# Patient Record
Sex: Male | Born: 1980
Health system: Southern US, Community
[De-identification: ages and names within clinical notes are randomized; demographics above are authoritative.]

## PROBLEM LIST (undated history)

## (undated) HISTORY — PX: FRACTURE SURGERY: SHX138

## (undated) HISTORY — PX: KNEE ARTHROSCOPY: SUR90

---

## 2017-09-21 ENCOUNTER — Encounter (HOSPITAL_COMMUNITY): Admission: EM | Disposition: A | Discharge status: Home / Self Care | Payer: Self-pay | Source: Home / Self Care

## 2017-09-21 ENCOUNTER — Inpatient Hospital Stay (HOSPITAL_COMMUNITY): Payer: Self-pay | Admitting: Anesthesiology

## 2017-09-21 ENCOUNTER — Inpatient Hospital Stay (HOSPITAL_COMMUNITY)
Admission: EM | Admit: 2017-09-21 | Discharge: 2017-09-22 | DRG: 510 | Disposition: A | Discharge status: Home / Self Care | Payer: Self-pay | Attending: General Surgery | Admitting: General Surgery

## 2017-09-21 ENCOUNTER — Emergency Department (HOSPITAL_COMMUNITY): Payer: Self-pay

## 2017-09-21 ENCOUNTER — Encounter (HOSPITAL_COMMUNITY): Payer: Self-pay

## 2017-09-21 DIAGNOSIS — S2232XA Fracture of one rib, left side, initial encounter for closed fracture: Secondary | ICD-10-CM | POA: Diagnosis present

## 2017-09-21 DIAGNOSIS — W19XXXA Unspecified fall, initial encounter: Secondary | ICD-10-CM

## 2017-09-21 DIAGNOSIS — S2239XA Fracture of one rib, unspecified side, initial encounter for closed fracture: Secondary | ICD-10-CM

## 2017-09-21 DIAGNOSIS — T1490XA Injury, unspecified, initial encounter: Secondary | ICD-10-CM | POA: Diagnosis present

## 2017-09-21 DIAGNOSIS — F17210 Nicotine dependence, cigarettes, uncomplicated: Secondary | ICD-10-CM | POA: Diagnosis present

## 2017-09-21 DIAGNOSIS — Y990 Civilian activity done for income or pay: Secondary | ICD-10-CM

## 2017-09-21 DIAGNOSIS — S066X9A Traumatic subarachnoid hemorrhage with loss of consciousness of unspecified duration, initial encounter: Secondary | ICD-10-CM | POA: Diagnosis present

## 2017-09-21 DIAGNOSIS — Y9389 Activity, other specified: Secondary | ICD-10-CM

## 2017-09-21 DIAGNOSIS — S59291A Other physeal fracture of lower end of radius, right arm, initial encounter for closed fracture: Principal | ICD-10-CM | POA: Diagnosis present

## 2017-09-21 DIAGNOSIS — W11XXXA Fall on and from ladder, initial encounter: Secondary | ICD-10-CM | POA: Diagnosis present

## 2017-09-21 DIAGNOSIS — W139XXA Fall from, out of or through building, not otherwise specified, initial encounter: Secondary | ICD-10-CM | POA: Diagnosis present

## 2017-09-21 DIAGNOSIS — Y92098 Other place in other non-institutional residence as the place of occurrence of the external cause: Secondary | ICD-10-CM

## 2017-09-21 HISTORY — PX: ORIF WRIST FRACTURE: SHX2133

## 2017-09-21 LAB — CBC
HEMATOCRIT: 44.1 % (ref 39.0–52.0)
HEMOGLOBIN: 14.6 g/dL (ref 13.0–17.0)
MCH: 31.9 pg (ref 26.0–34.0)
MCHC: 33.1 g/dL (ref 30.0–36.0)
MCV: 96.5 fL (ref 78.0–100.0)
Platelets: 216 10*3/uL (ref 150–400)
RBC: 4.57 MIL/uL (ref 4.22–5.81)
RDW: 12.8 % (ref 11.5–15.5)
WBC: 11.7 10*3/uL — ABNORMAL HIGH (ref 4.0–10.5)

## 2017-09-21 LAB — PROTIME-INR
INR: 0.95
Prothrombin Time: 12.6 seconds (ref 11.4–15.2)

## 2017-09-21 LAB — COMPREHENSIVE METABOLIC PANEL
ALBUMIN: 4.1 g/dL (ref 3.5–5.0)
ALK PHOS: 50 U/L (ref 38–126)
ALT: 33 U/L (ref 17–63)
ANION GAP: 8 (ref 5–15)
AST: 33 U/L (ref 15–41)
BILIRUBIN TOTAL: 1 mg/dL (ref 0.3–1.2)
BUN: 9 mg/dL (ref 6–20)
CALCIUM: 9.1 mg/dL (ref 8.9–10.3)
CO2: 24 mmol/L (ref 22–32)
CREATININE: 1.01 mg/dL (ref 0.61–1.24)
Chloride: 106 mmol/L (ref 101–111)
GFR calc Af Amer: >60 mL/min (ref >60)
GFR calc non Af Amer: >60 mL/min (ref >60)
GLUCOSE: 90 mg/dL (ref 65–99)
Potassium: 3.7 mmol/L (ref 3.5–5.1)
SODIUM: 138 mmol/L (ref 135–145)
Total Protein: 6.7 g/dL (ref 6.5–8.1)

## 2017-09-21 LAB — APTT: APTT: 25 s (ref 24–36)

## 2017-09-21 SURGERY — OPEN REDUCTION INTERNAL FIXATION (ORIF) WRIST FRACTURE
Anesthesia: General | Site: Arm Lower | Laterality: Right

## 2017-09-21 MED ORDER — BUPIVACAINE HCL (PF) 0.25 % IJ SOLN
INTRAMUSCULAR | Status: AC
  Filled 2017-09-21: qty 30

## 2017-09-21 MED ORDER — PROPOFOL 10 MG/ML IV BOLUS
INTRAVENOUS | Status: AC
  Filled 2017-09-21: qty 20

## 2017-09-21 MED ORDER — CEFAZOLIN SODIUM-DEXTROSE 2-3 GM-% IV SOLR
INTRAVENOUS | Status: DC | PRN
  Administered 2017-09-21: 2 g via INTRAVENOUS

## 2017-09-21 MED ORDER — ONDANSETRON HCL 4 MG/2ML IJ SOLN
INTRAMUSCULAR | Status: DC | PRN
  Administered 2017-09-21: 4 mg via INTRAVENOUS

## 2017-09-21 MED ORDER — HYDROCODONE-ACETAMINOPHEN 5-325 MG PO TABS
2.0000 | ORAL_TABLET | ORAL | Status: DC | PRN
  Administered 2017-09-21 – 2017-09-22 (×2): 2 via ORAL
  Filled 2017-09-21 (×2): qty 2

## 2017-09-21 MED ORDER — HYDROMORPHONE HCL 1 MG/ML IJ SOLN: 0.2500 mg | INTRAMUSCULAR | Status: DC | PRN

## 2017-09-21 MED ORDER — BUPIVACAINE HCL 0.25 % IJ SOLN
INTRAMUSCULAR | Status: DC | PRN
  Administered 2017-09-21: 30 mL

## 2017-09-21 MED ORDER — HYDROMORPHONE HCL 1 MG/ML IJ SOLN
0.5000 mg | INTRAMUSCULAR | Status: DC | PRN
  Administered 2017-09-21 – 2017-09-22 (×2): 0.5 mg via INTRAVENOUS
  Filled 2017-09-21 (×2): qty 1

## 2017-09-21 MED ORDER — HYDROMORPHONE HCL 1 MG/ML IJ SOLN
1.0000 mg | INTRAMUSCULAR | Status: DC | PRN
  Administered 2017-09-21: 1 mg via INTRAVENOUS
  Filled 2017-09-21: qty 1

## 2017-09-21 MED ORDER — ONDANSETRON HCL 4 MG/2ML IJ SOLN
INTRAMUSCULAR | Status: AC
  Filled 2017-09-21: qty 2

## 2017-09-21 MED ORDER — BUPIVACAINE HCL (PF) 0.5 % IJ SOLN
INTRAMUSCULAR | Status: AC
  Filled 2017-09-21: qty 30

## 2017-09-21 MED ORDER — DOCUSATE SODIUM 100 MG PO CAPS
100.0000 mg | ORAL_CAPSULE | Freq: Two times a day (BID) | ORAL | Status: DC
  Administered 2017-09-21 – 2017-09-22 (×2): 100 mg via ORAL
  Filled 2017-09-21 (×2): qty 1

## 2017-09-21 MED ORDER — HYDROMORPHONE HCL 1 MG/ML IJ SOLN
1.0000 mg | Freq: Once | INTRAMUSCULAR | Status: DC
  Filled 2017-09-21: qty 1

## 2017-09-21 MED ORDER — CEFAZOLIN SODIUM-DEXTROSE 2-4 GM/100ML-% IV SOLN
INTRAVENOUS | Status: AC
  Filled 2017-09-21: qty 100

## 2017-09-21 MED ORDER — FENTANYL CITRATE (PF) 250 MCG/5ML IJ SOLN
INTRAMUSCULAR | Status: AC
  Filled 2017-09-21: qty 5

## 2017-09-21 MED ORDER — PROPOFOL 10 MG/ML IV BOLUS
INTRAVENOUS | Status: DC | PRN
  Administered 2017-09-21: 150 mg via INTRAVENOUS

## 2017-09-21 MED ORDER — LIDOCAINE 2% (20 MG/ML) 5 ML SYRINGE
INTRAMUSCULAR | Status: DC | PRN
  Administered 2017-09-21: 60 mg via INTRAVENOUS

## 2017-09-21 MED ORDER — FENTANYL CITRATE (PF) 100 MCG/2ML IJ SOLN
50.0000 ug | Freq: Once | INTRAMUSCULAR | Status: AC
  Administered 2017-09-21: 50 ug via INTRAVENOUS
  Filled 2017-09-21: qty 2

## 2017-09-21 MED ORDER — 0.9 % SODIUM CHLORIDE (POUR BTL) OPTIME
TOPICAL | Status: DC | PRN
  Administered 2017-09-21: 1000 mL

## 2017-09-21 MED ORDER — MIDAZOLAM HCL 5 MG/5ML IJ SOLN
INTRAMUSCULAR | Status: DC | PRN
  Administered 2017-09-21: 2 mg via INTRAVENOUS

## 2017-09-21 MED ORDER — HYDROCODONE-ACETAMINOPHEN 5-325 MG PO TABS: 1.0000 | ORAL_TABLET | ORAL | Status: DC | PRN

## 2017-09-21 MED ORDER — NICOTINE 21 MG/24HR TD PT24: 21.0000 mg | MEDICATED_PATCH | Freq: Once | TRANSDERMAL | Status: DC

## 2017-09-21 MED ORDER — FENTANYL CITRATE (PF) 250 MCG/5ML IJ SOLN
INTRAMUSCULAR | Status: DC | PRN
  Administered 2017-09-21 (×2): 25 ug via INTRAVENOUS
  Administered 2017-09-21: 50 ug via INTRAVENOUS

## 2017-09-21 MED ORDER — ONDANSETRON HCL 4 MG/2ML IJ SOLN: 4.0000 mg | Freq: Four times a day (QID) | INTRAMUSCULAR | Status: DC | PRN

## 2017-09-21 MED ORDER — ONDANSETRON 4 MG PO TBDP
4.0000 mg | ORAL_TABLET | Freq: Four times a day (QID) | ORAL | Status: DC | PRN
  Filled 2017-09-21: qty 1

## 2017-09-21 MED ORDER — LACTATED RINGERS IV SOLN
INTRAVENOUS | Status: DC
  Administered 2017-09-21: 19:00:00 via INTRAVENOUS

## 2017-09-21 MED ORDER — HYDROMORPHONE HCL 1 MG/ML IJ SOLN
1.0000 mg | INTRAMUSCULAR | Status: DC | PRN
  Administered 2017-09-21: 1 mg via INTRAVENOUS

## 2017-09-21 MED ORDER — LIDOCAINE 2% (20 MG/ML) 5 ML SYRINGE
INTRAMUSCULAR | Status: AC
  Filled 2017-09-21: qty 5

## 2017-09-21 MED ORDER — ONDANSETRON HCL 4 MG/2ML IJ SOLN
4.0000 mg | Freq: Once | INTRAMUSCULAR | Status: AC
  Administered 2017-09-21: 4 mg via INTRAVENOUS
  Filled 2017-09-21: qty 2

## 2017-09-21 MED ORDER — ONDANSETRON HCL 4 MG/2ML IJ SOLN
4.0000 mg | Freq: Once | INTRAMUSCULAR | Status: DC
Start: 2017-09-21 — End: 2017-09-22

## 2017-09-21 MED ORDER — ACETAMINOPHEN 325 MG PO TABS
650.0000 mg | ORAL_TABLET | ORAL | Status: DC | PRN
  Administered 2017-09-22: 650 mg via ORAL
  Filled 2017-09-21: qty 2

## 2017-09-21 MED ORDER — LACTATED RINGERS IV SOLN
INTRAVENOUS | Status: DC
  Administered 2017-09-21: 22:00:00 via INTRAVENOUS

## 2017-09-21 MED ORDER — MIDAZOLAM HCL 2 MG/2ML IJ SOLN
INTRAMUSCULAR | Status: AC
  Filled 2017-09-21: qty 2

## 2017-09-21 SURGICAL SUPPLY — 47 items
BANDAGE ACE 3X5.8 VEL STRL LF (GAUZE/BANDAGES/DRESSINGS) ×3 IMPLANT
BIT DRILL 2 FAST STEP (BIT) ×3 IMPLANT
BIT DRILL 2.5X4 QC (BIT) ×3 IMPLANT
BNDG ESMARK 4X9 LF (GAUZE/BANDAGES/DRESSINGS) ×3 IMPLANT
CANISTER SUCTION 1500CC (MISCELLANEOUS) ×3 IMPLANT
CHLORAPREP W/TINT 26ML (MISCELLANEOUS) IMPLANT
CORDS BIPOLAR (ELECTRODE) ×3 IMPLANT
COVER SURGICAL LIGHT HANDLE (MISCELLANEOUS) ×3 IMPLANT
CUFF TOURNIQUET SINGLE 18IN (TOURNIQUET CUFF) ×3 IMPLANT
CUFF TOURNIQUET SINGLE 24IN (TOURNIQUET CUFF) IMPLANT
DECANTER SPIKE VIAL GLASS SM (MISCELLANEOUS) ×3 IMPLANT
DRAPE OEC MINIVIEW 54X84 (DRAPES) ×3 IMPLANT
DRAPE SURG 17X23 STRL (DRAPES) ×3 IMPLANT
GAUZE SPONGE 4X4 12PLY STRL (GAUZE/BANDAGES/DRESSINGS) ×3 IMPLANT
GAUZE XEROFORM 1X8 LF (GAUZE/BANDAGES/DRESSINGS) ×3 IMPLANT
GLOVE BIOGEL M 8.0 STRL (GLOVE) ×3 IMPLANT
GOWN STRL REUS W/ TWL LRG LVL3 (GOWN DISPOSABLE) ×2 IMPLANT
GOWN STRL REUS W/ TWL XL LVL3 (GOWN DISPOSABLE) ×1 IMPLANT
GOWN STRL REUS W/TWL LRG LVL3 (GOWN DISPOSABLE) ×4
GOWN STRL REUS W/TWL XL LVL3 (GOWN DISPOSABLE) ×2
K-WIRE 1.6 (WIRE) ×4
K-WIRE FX5X1.6XNS BN SS (WIRE) ×2
KIT BASIN OR (CUSTOM PROCEDURE TRAY) ×3 IMPLANT
KWIRE FX5X1.6XNS BN SS (WIRE) ×2 IMPLANT
NS IRRIG 1000ML POUR BTL (IV SOLUTION) ×3 IMPLANT
PACK ORTHO EXTREMITY (CUSTOM PROCEDURE TRAY) ×3 IMPLANT
PAD CAST 3X4 CTTN HI CHSV (CAST SUPPLIES) ×1 IMPLANT
PAD CAST 4YDX4 CTTN HI CHSV (CAST SUPPLIES) ×1 IMPLANT
PADDING CAST COTTON 3X4 STRL (CAST SUPPLIES) ×2
PADDING CAST COTTON 4X4 STRL (CAST SUPPLIES) ×2
PEG SUBCHONDRAL SMOOTH 2.0X18 (Peg) ×21 IMPLANT
PEG THREADED 2.5MMX20MM LONG (Peg) ×12 IMPLANT
PLATE SHORT 24.4X51.3 RT (Plate) ×3 IMPLANT
SCREW CORT 3.5X14 LNG (Screw) ×9 IMPLANT
SOAP 2 % CHG 4 OZ (WOUND CARE) ×3 IMPLANT
SUT ETHILON 3 0 PS 1 (SUTURE) IMPLANT
SUT ETHILON 4 0 PS 2 18 (SUTURE) IMPLANT
SUT MNCRL AB 4-0 PS2 18 (SUTURE) ×3 IMPLANT
SUT VIC AB 3-0 FS2 27 (SUTURE) ×3 IMPLANT
SUT VIC AB 3-0 PS1 18 (SUTURE)
SUT VIC AB 3-0 PS1 18XBRD (SUTURE) IMPLANT
SUT VICRYL 4-0 PS2 18IN ABS (SUTURE) IMPLANT
SYR BULB 3OZ (MISCELLANEOUS) IMPLANT
TOWEL OR 17X24 6PK STRL BLUE (TOWEL DISPOSABLE) ×3 IMPLANT
TUBE CONNECTING 20'X1/4 (TUBING) ×1
TUBE CONNECTING 20X1/4 (TUBING) ×2 IMPLANT
UNDERPAD 30X30 (UNDERPADS AND DIAPERS) ×3 IMPLANT

## 2017-09-21 NOTE — Consult Note (Signed)
Reason for Consult:traumatic subarachnoid hemorrhage Referring Physician: Garek, Schuneman is an 36 y.o. male.  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 While climbing down a ladder he fell. He remembers trying to break his fall by holding on to the homes gutter, that did not hold, landed on a bush then the ground. He did lose consciousness, he does not know how long, awoke and tried to get up. His right wrist did not support his weight. A neighbor spotted him and took him to an urgent care, where he was then sent to the [facility] ED. Head ct showed a small amount of blood, he also has a right wrist fracture, and a broken rib.   History reviewed. No pertinent past medical history.  Past Surgical History:  Procedure Laterality Date  . KNEE ARTHROSCOPY Right     No family history on file.  Social History:  reports that he has been smoking Cigarettes.  He has never used smokeless tobacco. He reports that he drinks alcohol. He reports that he does not use drugs.  Allergies: No Known Allergies  Medications: I have reviewed the patient's current medications.  No results found for this or any previous visit (from the past 48 hour(s)).  Dg Chest 2 View  Result Date: 09/21/2017 CLINICAL DATA:  Fall from ladder, left rib pain EXAM: CHEST  2 VIEW COMPARISON:  None. FINDINGS: Low lung volumes. Normal heart size. Normal mediastinal contour. No pneumothorax. No pleural effusion. Mild bibasilar atelectasis. No pulmonary edema. Acute mildly displaced anterolateral left fifth rib fracture. IMPRESSION: 1. Acute mildly displaced anterolateral left fifth rib fracture. 2. No pneumothorax. 3. Low lung volumes with mild bibasilar atelectasis. Electronically Signed   By: Delbert Phenix M.D.   On: 09/21/2017 15:03   Dg Wrist Complete Right  Result Date: 09/21/2017 CLINICAL DATA:  Patient porch falling 12-14 feet from a ladder and has deformity of the right forearm as well as right rib pain. EXAM: RIGHT  WRIST - COMPLETE 3+ VIEW COMPARISON:  None in PACs FINDINGS: The patient has sustained a comminuted impacted fracture of the distal radial metaphysis. There is angulation at the fracture site with the apex volar. The adjacent ulna is intact. The carpal bones and metacarpal bases are normal. IMPRESSION: Acute impacted comminuted angulated fracture of the distal right radial metaphysis. Electronically Signed   By: David  [place] M.D.   On: 09/21/2017 15:02   Ct Head Wo Contrast  Result Date: 09/21/2017 CLINICAL DATA:  Pain following fall EXAM: CT HEAD WITHOUT CONTRAST CT CERVICAL SPINE WITHOUT CONTRAST TECHNIQUE: Multidetector CT imaging of the head and cervical spine was performed following the standard protocol without intravenous contrast. Multiplanar CT image reconstructions of the cervical spine were also generated. COMPARISON:  None. FINDINGS: CT HEAD FINDINGS Brain: The ventricles are normal in size and configuration. A small focus of subarachnoid hemorrhage is noted just to the left of the falx at the level of the rostrum of the corpus callosum just to the left of midline. No other intra-axial hemorrhage is seen. There is no appreciable subdural or epidural fluid. There is no mass or midline shift. Gray-white compartments appear normal. No acute infarct. Vascular: No appreciable hyperdense vessel. There are no foci of arteriovascular calcification evident. Skull: Bony calvarium appears intact. Sinuses/Orbits: There is mucosal thickening in several ethmoid air cells bilaterally. There is opacification in the right sphenoid sinus region with probable polyp in this area measuring 8 x 7 mm. Other visualized paranasal sinuses are clear. Orbits  appear symmetric bilaterally. Other: Mastoid air cells are clear. CT CERVICAL SPINE FINDINGS Alignment:  There is no appreciable spondylolisthesis. Skull base and vertebrae: Skull base and craniocervical junction regions appear normal. There is no evident fracture. No  blastic or lytic bone lesions. Soft tissues and spinal canal: Prevertebral soft tissues and predental space regions are normal. No paraspinous lesions. No evident cord or canal hematoma. Disc levels: There is moderate disc space narrowing at C5-6. There is slight disc space narrowing at C6-7 and C7-T1. There are posterior osteophytes at C5 and C6. There is an anterior osteophyte along the inferior aspect of C5. There is exit foraminal narrowing on the right at C5-6 due to bony hypertrophy. There is no frank disc extrusion or stenosis. Upper chest: Visualized upper lung zones are clear. Other:  None IMPRESSION: CT head: Small focus of subarachnoid hemorrhage just to the left of the falx at the level of the rostrum of the corpus callosum just left of midline. There is a focal area of hemorrhage measures 1.5 x 0.6 cm. There is no subdural or epidural fluid collection. Ventricles are normal in size and configuration. No midline shift or mass effect. No intracranial edema. There are foci of paranasal sinus disease. CT cervical spine: No fracture or spondylolisthesis. 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 Critical Value/emergent results were called by telephone at the time of interpretation on 09/21/2017 at 3:09 pm to [facility], [facility] , who verbally acknowledged these results. Electronically Signed   By: Bretta Bang III M.D.   On: 09/21/2017 15:10   Ct Cervical Spine Wo Contrast  Result Date: 09/21/2017 CLINICAL DATA:  Pain following fall EXAM: CT HEAD WITHOUT CONTRAST CT CERVICAL SPINE WITHOUT CONTRAST TECHNIQUE: Multidetector CT imaging of the head and cervical spine was performed following the standard protocol without intravenous contrast. Multiplanar CT image reconstructions of the cervical spine were also generated. COMPARISON:  None. FINDINGS: CT HEAD FINDINGS Brain: The ventricles are normal in size and configuration. A small focus of subarachnoid hemorrhage is noted just to the  left of the falx at the level of the rostrum of the corpus callosum just to the left of midline. No other intra-axial hemorrhage is seen. There is no appreciable subdural or epidural fluid. There is no mass or midline shift. Gray-white compartments appear normal. No acute infarct. Vascular: No appreciable hyperdense vessel. There are no foci of arteriovascular calcification evident. Skull: Bony calvarium appears intact. Sinuses/Orbits: There is mucosal thickening in several ethmoid air cells bilaterally. There is opacification in the right sphenoid sinus region with probable polyp in this area measuring 8 x 7 mm. Other visualized paranasal sinuses are clear. Orbits appear symmetric bilaterally. Other: Mastoid air cells are clear. CT CERVICAL SPINE FINDINGS Alignment:  There is no appreciable spondylolisthesis. Skull base and vertebrae: Skull base and craniocervical junction regions appear normal. There is no evident fracture. No blastic or lytic bone lesions. Soft tissues and spinal canal: Prevertebral soft tissues and predental space regions are normal. No paraspinous lesions. No evident cord or canal hematoma. Disc levels: There is moderate disc space narrowing at C5-6. There is slight disc space narrowing at C6-7 and C7-T1. There are posterior osteophytes at C5 and C6. There is an anterior osteophyte along the inferior aspect of C5. There is exit foraminal narrowing on the right at C5-6 due to bony hypertrophy. There is no frank disc extrusion or stenosis. Upper chest: Visualized upper lung zones are clear. Other:  None IMPRESSION: CT head: Small  focus of subarachnoid hemorrhage just to the left of the falx at the level of the rostrum of the corpus callosum just left of midline. There is a focal area of hemorrhage measures 1.5 x 0.6 cm. There is no subdural or epidural fluid collection. Ventricles are normal in size and configuration. No midline shift or mass effect. No intracranial edema. There are foci of  paranasal sinus disease. CT cervical spine: No fracture or spondylolisthesis. 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 Critical Value/emergent results were called by telephone at the time of interpretation on 09/21/2017 at 3:09 pm to [facility], [facility] , who verbally acknowledged these results. Electronically Signed   By: Bretta Bang III M.D.   On: 09/21/2017 15:10    Review of Systems  Constitutional: Negative.   HENT: Negative.   Eyes: Negative.   Respiratory: Negative.   Cardiovascular: Negative.   Gastrointestinal: Negative.   Genitourinary: Negative.   Musculoskeletal: Positive for falls.  Skin: Negative.   Neurological: Negative.   Endo/Heme/Allergies: Negative.   Psychiatric/Behavioral: Negative.    Blood pressure 136/76, pulse 96, temperature 98.3 F (36.8 C), temperature source Oral, resp. rate 19, SpO2 96 %. Physical Exam  Constitutional: He appears well-developed and well-nourished. No distress.  HENT:  Head: Normocephalic and atraumatic.  Right Ear: External ear normal.  Left Ear: External ear normal.  Mouth/Throat: Oropharynx is clear and moist.  Eyes: Pupils are equal, round, and reactive to light. Conjunctivae and EOM are normal.  Neck: Normal range of motion. Neck supple.  Cardiovascular: Normal rate, regular rhythm, normal heart sounds and intact distal pulses.   Respiratory:  Painful breathing  GI: Soft. Bowel sounds are normal.  Musculoskeletal:  Right wrist is fractured  Skin: Skin is warm and dry.  Psychiatric: He has a normal mood and affect. His behavior is normal. Judgment and thought content normal.    Assessment/Plan: Mr. Schreiner will be admitted to the hospital for his injuries. No reason to repeat head ct unless his exam changes. I expect he will do well. No surgical intervention planned. Will follow.  Dagon Budai L 09/21/2017, 4:36 PM
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
## 2017-09-21 NOTE — H&P (Signed)
[facility] Trauma Admission Note  Anthony Roman Jan 31, 1981  [id number].    Requesting MD: Arthor Captain PA-C Chief Complaint/Reason for Consult: Fall from ladder HPI:  Patient is a 36 y/o male who fell from a ladder earlier today. Non trauma-code. Reports brief LOC, ambulatory at scene and drove self to urgent care. Complaining of pain in head, right wrist and left chest, blood from left ear. Denies SOB, palpitations, nausea, vomting, blurred vision, dizzness, hearing loss. Does report some numbness and tingling in right index and middle finger. 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 He lives in [place], [place] normally but here for work. He reports no PMH and takes no daily medications. Reports 1ppd cigarettes, occasional heavy alcohol use although not currently, no illicit drug use.   ROS: Review of Systems  Constitutional: Negative for diaphoresis and malaise/fatigue.  HENT: Negative for ear pain, hearing loss and tinnitus.   Eyes: Negative for blurred vision and double vision.  Respiratory: Negative for shortness of breath and wheezing.   Cardiovascular: Positive for chest pain. Negative for palpitations.  Gastrointestinal: Negative for abdominal pain, nausea and vomiting.  Musculoskeletal: Positive for joint pain (right wrist). Negative for back pain and neck pain.  Neurological: Positive for tingling, loss of consciousness and headaches. Negative for dizziness and speech change.  All other systems reviewed and are negative.   No family history on file.  History reviewed. No pertinent past medical history.  Past Surgical History:  Procedure Laterality Date  . KNEE ARTHROSCOPY Right     Social History:  reports that he has been smoking Cigarettes.  He has never used smokeless tobacco. He reports that he drinks alcohol. He reports that he does not use drugs.  Allergies: No Known Allergies   (Not in a hospital admission)  Blood pressure  137/64, pulse 86, temperature 98.3 F (36.8 C), temperature source Oral, resp. rate 18, SpO2 97 %. Physical Exam: Physical Exam  Constitutional: He is oriented to person, place, and time. Vital signs are normal. He appears well-developed and well-nourished. He is cooperative.  Non-toxic appearance. No distress.  HENT:  Head: Normocephalic. Head is with laceration (anterior to left tragus). Head is without raccoon's eyes and without Battle's sign.  Right Ear: Tympanic membrane, external ear and ear canal normal.  Left Ear: Tympanic membrane, external ear and ear canal normal.  Nose: Nose normal. No nasal septal hematoma.  Mouth/Throat: Oropharynx is clear and moist and mucous membranes are normal.  Eyes: Pupils are equal, round, and reactive to light. Conjunctivae, EOM and lids are normal. No scleral icterus.  Neck: Normal range of motion. Neck supple. No spinous process tenderness and no muscular tenderness present. No tracheal deviation present. No thyromegaly present.  Cardiovascular: Normal rate, regular rhythm, S1 normal and S2 normal.   Pulses:      Radial pulses are 2+ on the right side, and 2+ on the left side.       Dorsalis pedis pulses are 2+ on the right side, and 2+ on the left side.  Pulmonary/Chest: Effort normal and breath sounds normal. He exhibits bony tenderness (left chest ). He exhibits no crepitus and no deformity.  Abdominal: Soft. Normal appearance and bowel sounds are normal. He exhibits no distension and no mass. There is no hepatosplenomegaly. There is no tenderness. There is no rigidity, no rebound and no guarding. No hernia.  Musculoskeletal:       Right shoulder: Normal.       Left  shoulder: Normal.       Right elbow: Normal.      Left elbow: Normal.       Right wrist: He exhibits tenderness, bony tenderness, swelling and deformity.       Left wrist: Normal.  ROM intact in bilateral lower extremities  Neurological: He is alert and oriented to person, place, and  time. No sensory deficit. GCS eye subscore is 4. GCS verbal subscore is 5. GCS motor subscore is 6.  Skin: Skin is warm and dry. No rash noted. He is not diaphoretic. No pallor.  Psychiatric: He has a normal mood and affect. His speech is normal and behavior is normal.    No results found for this or any previous visit (from the past 48 hour(s)). Dg Chest 2 View  Result Date: 09/21/2017 CLINICAL DATA:  Fall from ladder, left rib pain EXAM: CHEST  2 VIEW COMPARISON:  None. FINDINGS: Low lung volumes. Normal heart size. Normal mediastinal contour. No pneumothorax. No pleural effusion. Mild bibasilar atelectasis. No pulmonary edema. Acute mildly displaced anterolateral left fifth rib fracture. IMPRESSION: 1. Acute mildly displaced anterolateral left fifth rib fracture. 2. No pneumothorax. 3. Low lung volumes with mild bibasilar atelectasis. Electronically Signed   By: Delbert Phenix M.D.   On: 09/21/2017 15:03   Dg Wrist Complete Right  Result Date: 09/21/2017 CLINICAL DATA:  Patient porch falling 12-14 feet from a ladder and has deformity of the right forearm as well as right rib pain. EXAM: RIGHT WRIST - COMPLETE 3+ VIEW COMPARISON:  None in PACs FINDINGS: The patient has sustained a comminuted impacted fracture of the distal radial metaphysis. There is angulation at the fracture site with the apex volar. The adjacent ulna is intact. The carpal bones and metacarpal bases are normal. IMPRESSION: Acute impacted comminuted angulated fracture of the distal right radial metaphysis. Electronically Signed   By: David  Swaziland M.D.   On: 09/21/2017 15:02   Ct Head Wo Contrast  Result Date: 09/21/2017 CLINICAL DATA:  Pain following fall EXAM: CT HEAD WITHOUT CONTRAST CT CERVICAL SPINE WITHOUT CONTRAST TECHNIQUE: Multidetector CT imaging of the head and cervical spine was performed following the standard protocol without intravenous contrast. Multiplanar CT image reconstructions of the cervical spine were also  generated. COMPARISON:  None. FINDINGS: CT HEAD FINDINGS Brain: The ventricles are normal in size and configuration. A small focus of subarachnoid hemorrhage is noted just to the left of the falx at the level of the rostrum of the corpus callosum just to the left of midline. No other intra-axial hemorrhage is seen. There is no appreciable subdural or epidural fluid. There is no mass or midline shift. Gray-Chloris Marcoux compartments appear normal. No acute infarct. Vascular: No appreciable hyperdense vessel. There are no foci of arteriovascular calcification evident. Skull: Bony calvarium appears intact. Sinuses/Orbits: There is mucosal thickening in several ethmoid air cells bilaterally. There is opacification in the right sphenoid sinus region with probable polyp in this area measuring 8 x 7 mm. Other visualized paranasal sinuses are clear. Orbits appear symmetric bilaterally. Other: Mastoid air cells are clear. CT CERVICAL SPINE FINDINGS Alignment:  There is no appreciable spondylolisthesis. Skull base and vertebrae: Skull base and craniocervical junction regions appear normal. There is no evident fracture. No blastic or lytic bone lesions. Soft tissues and spinal canal: Prevertebral soft tissues and predental space regions are normal. No paraspinous lesions. No evident cord or canal hematoma. Disc levels: There is moderate disc space narrowing at C5-6. There is slight disc space  narrowing at C6-7 and C7-T1. There are posterior osteophytes at C5 and C6. There is an anterior osteophyte along the inferior aspect of C5. There is exit foraminal narrowing on the right at C5-6 due to bony hypertrophy. There is no frank disc extrusion or stenosis. Upper chest: Visualized upper lung zones are clear. Other:  None IMPRESSION: CT head: Small focus of subarachnoid hemorrhage just to the left of the falx at the level of the rostrum of the corpus callosum just left of midline. There is a focal area of hemorrhage measures 1.5 x 0.6 cm.  There is no subdural or epidural fluid collection. Ventricles are normal in size and configuration. No midline shift or mass effect. No intracranial edema. There are foci of paranasal sinus disease. CT cervical spine: No fracture or spondylolisthesis. 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 Critical Value/emergent results were called by telephone at the time of interpretation on 09/21/2017 at 3:09 pm to [facility], [place] , who verbally acknowledged these results. Electronically Signed   By: Bretta Bang III M.D.   On: 09/21/2017 15:10   Ct Cervical Spine Wo Contrast  Result Date: 09/21/2017 CLINICAL DATA:  Pain following fall EXAM: CT HEAD WITHOUT CONTRAST CT CERVICAL SPINE WITHOUT CONTRAST TECHNIQUE: Multidetector CT imaging of the head and cervical spine was performed following the standard protocol without intravenous contrast. Multiplanar CT image reconstructions of the cervical spine were also generated. COMPARISON:  None. FINDINGS: CT HEAD FINDINGS Brain: The ventricles are normal in size and configuration. A small focus of subarachnoid hemorrhage is noted just to the left of the falx at the level of the rostrum of the corpus callosum just to the left of midline. No other intra-axial hemorrhage is seen. There is no appreciable subdural or epidural fluid. There is no mass or midline shift. Gray-Dean Goldner compartments appear normal. No acute infarct. Vascular: No appreciable hyperdense vessel. There are no foci of arteriovascular calcification evident. Skull: Bony calvarium appears intact. Sinuses/Orbits: There is mucosal thickening in several ethmoid air cells bilaterally. There is opacification in the right sphenoid sinus region with probable polyp in this area measuring 8 x 7 mm. Other visualized paranasal sinuses are clear. Orbits appear symmetric bilaterally. Other: Mastoid air cells are clear. CT CERVICAL SPINE FINDINGS Alignment:  There is no appreciable spondylolisthesis.  Skull base and vertebrae: Skull base and craniocervical junction regions appear normal. There is no evident fracture. No blastic or lytic bone lesions. Soft tissues and spinal canal: Prevertebral soft tissues and predental space regions are normal. No paraspinous lesions. No evident cord or canal hematoma. Disc levels: There is moderate disc space narrowing at C5-6. There is slight disc space narrowing at C6-7 and C7-T1. There are posterior osteophytes at C5 and C6. There is an anterior osteophyte along the inferior aspect of C5. There is exit foraminal narrowing on the right at C5-6 due to bony hypertrophy. There is no frank disc extrusion or stenosis. Upper chest: Visualized upper lung zones are clear. Other:  None IMPRESSION: CT head: Small focus of subarachnoid hemorrhage just to the left of the falx at the level of the rostrum of the corpus callosum just left of midline. There is a focal area of hemorrhage measures 1.5 x 0.6 cm. There is no subdural or epidural fluid collection. Ventricles are normal in size and configuration. No midline shift or mass effect. No intracranial edema. There are foci of paranasal sinus disease. CT cervical spine: No fracture or spondylolisthesis. Osteoarthritic change at several levels, most  notably at C5-6. Critical Value/emergent results were called by telephone at the time of interpretation on 09/21/2017 at 3:09 pm to [facility], [place] , who verbally acknowledged these results. Electronically Signed   By: Bretta Bang III M.D.   On: 09/21/2017 15:10      Assessment/Plan Fall from ladder [facility]  - NS consulted and recommends repeat CT if any change in neuro exam, otherwise just monitor Left 5th rib fx - pain control, pulm toilet, IS Right radial fracture - maintain shortarm splint - patient is leaning towards surgical fixation of wrist - awaiting NS clearance for ortho OR  FEN: NPO  VTE: SCDs, no lovenox due to [facility] ID: no current abx  Dispo: Admit for  pain control, observation of neuro status and possible OR for right wrist  Wells Guiles, [facility] Surgery 09/21/2017, 4:20 PM Pager: [phone] Consults: [phone] Mon-Fri 7:00 am-4:30 pm Sat-Sun 7:00 am-11:30 am  Patient seen and examined. I agree with the assessment and plan. Fall from roof while at work - trauma consult for multisystem injury. Brief LOC. Ambulatory on scene and actually drove himself from an urgent care to [facility]. Primary survey adequate. Secondary survey - headache, left chest wall pain, right wrist pain. Denies any other complaints.  Injury Summary Small SAH L 5th rib fx R distal radius fx  -Neurosurgery has seen/evaluated and recommended observation; cleared for OR with ortho -Ortho taking to OR for ORIF -Pain control for rib fx, IS 10x/hr while awake  Stephanie Coup. Cliffton Asters, M.D. [facility]

## 2017-09-21 NOTE — Anesthesia Procedure Notes (Signed)
Procedure Name: LMA Insertion Date/Time: 09/21/2017 7:14 PM Performed by: Lucinda Dell Pre-anesthesia Checklist: Patient identified, Emergency Drugs available, Suction available and Patient being monitored Patient Re-evaluated:Patient Re-evaluated prior to induction Oxygen Delivery Method: Circle system utilized Preoxygenation: Pre-oxygenation with 100% oxygen Induction Type: IV induction LMA: LMA inserted LMA Size: 4.0 Tube type: Oral Number of attempts: 1 Placement Confirmation: positive ETCO2 and breath sounds checked- equal and bilateral Tube secured with: Tape Dental Injury: Teeth and Oropharynx as per pre-operative assessment

## 2017-09-21 NOTE — Anesthesia Postprocedure Evaluation (Signed)
Anesthesia Post Note  Patient: Anthony Roman  Procedure(s) Performed: Procedure(s) (LRB): OPEN REDUCTION INTERNAL FIXATION (ORIF) WRIST FRACTURE (Right)     Patient location during evaluation: PACU Anesthesia Type: General Level of consciousness: awake and alert Pain management: pain level controlled Vital Signs Assessment: post-procedure vital signs reviewed and stable Respiratory status: spontaneous breathing, nonlabored ventilation and respiratory function stable Cardiovascular status: blood pressure returned to baseline and stable Postop Assessment: no apparent nausea or vomiting Anesthetic complications: no    Last Vitals:  Vitals:   09/21/17 2100 09/21/17 2115  BP:    Pulse: 90   Resp: 19   Temp:  37.1 C  SpO2: 95%     Last Pain:  Vitals:   09/21/17 2045  TempSrc:   PainSc: Asleep                 Ervin Hensley,W. EDMOND
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
## 2017-09-21 NOTE — ED Notes (Signed)
repaged [place] to [place] @ [id number]

## 2017-09-21 NOTE — ED Triage Notes (Signed)
Pt arrives EMS from Urgent care where he had driven himself after falling 12-14 feet off ladder. Pt has deformity to r forearm, splinted with SAM splint and cling by EMS. Also noted blood from left ear with swelling at cheek bone. Given 100 mcg fentanyl PTA. c- collar in place.also c/o left axillary rib pain.

## 2017-09-21 NOTE — ED Notes (Signed)
Patient transported to X-ray 

## 2017-09-21 NOTE — ED Notes (Signed)
Neurosurgeon at bedside °

## 2017-09-21 NOTE — Anesthesia Preprocedure Evaluation (Addendum)
Anesthesia Evaluation  Patient identified by MRN, date of birth, ID band Patient awake    Reviewed: Allergy & Precautions, H&P , NPO status , Patient's Chart, lab work & pertinent test results  Airway Mallampati: IV  TM Distance: >3 FB Neck ROM: Full    Dental no notable dental hx. (+) Teeth Intact, Dental Advisory Given   Pulmonary Current Smoker,    Pulmonary exam normal breath sounds clear to auscultation       Cardiovascular negative cardio ROS   Rhythm:Regular Rate:Normal     Neuro/Psych negative neurological ROS  negative psych ROS   GI/Hepatic negative GI ROS, Neg liver ROS,   Endo/Other  negative endocrine ROS  Renal/GU negative Renal ROS  negative genitourinary   Musculoskeletal   Abdominal   Peds  Hematology negative hematology ROS (+)   Anesthesia Other Findings   Reproductive/Obstetrics negative OB ROS                            Anesthesia Physical Anesthesia Plan  ASA: II  Anesthesia Plan: General   Post-op Pain Management:    Induction: Intravenous  PONV Risk Score and Plan: 1 and Ondansetron and Dexamethasone  Airway Management Planned: LMA  Additional Equipment:   Intra-op Plan:   Post-operative Plan: Extubation in OR  Informed Consent: I have reviewed the patients History and Physical, chart, labs and discussed the procedure including the risks, benefits and alternatives for the proposed anesthesia with the patient or authorized representative who has indicated his/her understanding and acceptance.   Dental advisory given  Plan Discussed with: CRNA  Anesthesia Plan Comments: (Pt declines block)       Anesthesia Quick Evaluation

## 2017-09-21 NOTE — Transfer of Care (Signed)
Immediate Anesthesia Transfer of Care Note  Patient: Anthony Roman  Procedure(s) Performed: Procedure(s): OPEN REDUCTION INTERNAL FIXATION (ORIF) WRIST FRACTURE (Right)  Patient Location: PACU  Anesthesia Type:General  Level of Consciousness: awake, alert , oriented and patient cooperative  Airway & Oxygen Therapy: Patient Spontanous Breathing and Patient connected to nasal cannula oxygen  Post-op Assessment: Report given to RN, Post -op Vital signs reviewed and stable and Patient moving all extremities  Post vital signs: Reviewed and stable  Last Vitals:  Vitals:   09/21/17 1800 09/21/17 2023  BP: 134/90 (P) 117/80  Pulse: 96   Resp: 18   Temp:  (P) 37 C  SpO2: 93% (P) 98%    Last Pain:  Vitals:   09/21/17 1816  TempSrc:   PainSc: 3          Complications: No apparent anesthesia complications

## 2017-09-22 ENCOUNTER — Inpatient Hospital Stay (HOSPITAL_COMMUNITY): Payer: Self-pay

## 2017-09-22 ENCOUNTER — Encounter (HOSPITAL_COMMUNITY): Payer: Self-pay | Admitting: General Practice

## 2017-09-22 LAB — CBC
HCT: 43.7 % (ref 39.0–52.0)
HEMOGLOBIN: 14.4 g/dL (ref 13.0–17.0)
MCH: 32.4 pg (ref 26.0–34.0)
MCHC: 33 g/dL (ref 30.0–36.0)
MCV: 98.2 fL (ref 78.0–100.0)
Platelets: 207 10*3/uL (ref 150–400)
RBC: 4.45 MIL/uL (ref 4.22–5.81)
RDW: 12.9 % (ref 11.5–15.5)
WBC: 10.2 10*3/uL (ref 4.0–10.5)

## 2017-09-22 LAB — HIV ANTIBODY (ROUTINE TESTING W REFLEX): HIV SCREEN 4TH GENERATION: NONREACTIVE

## 2017-09-22 MED ORDER — METHOCARBAMOL 750 MG PO TABS
750.0000 mg | ORAL_TABLET | Freq: Three times a day (TID) | ORAL | Status: DC | PRN
  Administered 2017-09-22: 750 mg via ORAL
  Filled 2017-09-22: qty 1

## 2017-09-22 MED ORDER — METHOCARBAMOL 750 MG PO TABS: 750.0000 mg | ORAL_TABLET | Freq: Three times a day (TID) | ORAL | 0 refills | Status: AC | PRN

## 2017-09-22 MED ORDER — HYDROCODONE-ACETAMINOPHEN 5-325 MG PO TABS: 1.0000 | ORAL_TABLET | ORAL | 0 refills | Status: AC | PRN

## 2017-09-22 MED ORDER — ACETAMINOPHEN 325 MG PO TABS: 650.0000 mg | ORAL_TABLET | Freq: Four times a day (QID) | ORAL | Status: AC | PRN

## 2017-09-22 MED FILL — HYDROCODON-APAP 5-325: 5-325 | 5 days supply | Qty: 30 | Fill #0

## 2017-09-22 MED FILL — METHOCARBAMOL 750 MG TABLET: 750 | 3 days supply | Qty: 10 | Fill #0

## 2017-09-22 NOTE — Discharge Summary (Signed)
[facility] Discharge Summary   Patient ID: Anthony Roman MRN: [id number] DOB/AGE: July 21, 1981 36 y.o.  Admit date: 09/21/2017 Discharge date: 09/22/2017 Discharge Diagnosis Patient Active Problem List   Diagnosis Date Noted  . Fall from, out of or through building, not otherwise specified, initial encounter 09/21/2017   Left 5th rib fracture    Right radial fracture    SAH    . Trauma 09/21/2017    Consultants Orthopedic surgery Neurosurgery   Imaging: Dg Chest 2 View  Result Date: 09/21/2017 CLINICAL DATA:  Fall from ladder, left rib pain EXAM: CHEST  2 VIEW COMPARISON:  None. FINDINGS: Low lung volumes. Normal heart size. Normal mediastinal contour. No pneumothorax. No pleural effusion. Mild bibasilar atelectasis. No pulmonary edema. Acute mildly displaced anterolateral left fifth rib fracture. IMPRESSION: 1. Acute mildly displaced anterolateral left fifth rib fracture. 2. No pneumothorax. 3. Low lung volumes with mild bibasilar atelectasis. Electronically Signed   By: Delbert Phenix M.D.   On: 09/21/2017 15:03   Dg Wrist Complete Right  Result Date: 09/21/2017 CLINICAL DATA:  Patient porch falling 12-14 feet from a ladder and has deformity of the right forearm as well as right rib pain. EXAM: RIGHT WRIST - COMPLETE 3+ VIEW COMPARISON:  None in PACs FINDINGS: The patient has sustained a comminuted impacted fracture of the distal radial metaphysis. There is angulation at the fracture site with the apex volar. The adjacent ulna is intact. The carpal bones and metacarpal bases are normal. IMPRESSION: Acute impacted comminuted angulated fracture of the distal right radial metaphysis. Electronically Signed   By: David  [place] M.D.   On: 09/21/2017 15:02   Ct Head Wo Contrast  Result Date: 09/21/2017 CLINICAL DATA:  Pain following fall EXAM: CT HEAD WITHOUT CONTRAST CT CERVICAL SPINE WITHOUT CONTRAST TECHNIQUE: Multidetector CT imaging of the head and cervical spine was performed  following the standard protocol without intravenous contrast. Multiplanar CT image reconstructions of the cervical spine were also generated. COMPARISON:  None. FINDINGS: CT HEAD FINDINGS Brain: The ventricles are normal in size and configuration. A small focus of subarachnoid hemorrhage is noted just to the left of the falx at the level of the rostrum of the corpus callosum just to the left of midline. No other intra-axial hemorrhage is seen. There is no appreciable subdural or epidural fluid. There is no mass or midline shift. Gray-white compartments appear normal. No acute infarct. Vascular: No appreciable hyperdense vessel. There are no foci of arteriovascular calcification evident. Skull: Bony calvarium appears intact. Sinuses/Orbits: There is mucosal thickening in several ethmoid air cells bilaterally. There is opacification in the right sphenoid sinus region with probable polyp in this area measuring 8 x 7 mm. Other visualized paranasal sinuses are clear. Orbits appear symmetric bilaterally. Other: Mastoid air cells are clear. CT CERVICAL SPINE FINDINGS Alignment:  There is no appreciable spondylolisthesis. Skull base and vertebrae: Skull base and craniocervical junction regions appear normal. There is no evident fracture. No blastic or lytic bone lesions. Soft tissues and spinal canal: Prevertebral soft tissues and predental space regions are normal. No paraspinous lesions. No evident cord or canal hematoma. Disc levels: There is moderate disc space narrowing at C5-6. There is slight disc space narrowing at C6-7 and C7-T1. There are posterior osteophytes at C5 and C6. There is an anterior osteophyte along the inferior aspect of C5. There is exit foraminal narrowing on the right at C5-6 due to bony hypertrophy. There is no frank disc extrusion or stenosis. Upper chest: Visualized upper  lung zones are clear. Other:  None IMPRESSION: CT head: Small focus of subarachnoid hemorrhage just to the left of the falx  at the level of the rostrum of the corpus callosum just left of midline. There is a focal area of hemorrhage measures 1.5 x 0.6 cm. There is no subdural or epidural fluid collection. Ventricles are normal in size and configuration. No midline shift or mass effect. No intracranial edema. There are foci of paranasal sinus disease. CT cervical spine: No fracture or spondylolisthesis. 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 Critical Value/emergent results were called by telephone at the time of interpretation on 09/21/2017 at 3:09 pm to [facility], [facility] , who verbally acknowledged these results. Electronically Signed   By: Bretta Bang III M.D.   On: 09/21/2017 15:10   Ct Cervical Spine Wo Contrast  Result Date: 09/21/2017 CLINICAL DATA:  Pain following fall EXAM: CT HEAD WITHOUT CONTRAST CT CERVICAL SPINE WITHOUT CONTRAST TECHNIQUE: Multidetector CT imaging of the head and cervical spine was performed following the standard protocol without intravenous contrast. Multiplanar CT image reconstructions of the cervical spine were also generated. COMPARISON:  None. FINDINGS: CT HEAD FINDINGS Brain: The ventricles are normal in size and configuration. A small focus of subarachnoid hemorrhage is noted just to the left of the falx at the level of the rostrum of the corpus callosum just to the left of midline. No other intra-axial hemorrhage is seen. There is no appreciable subdural or epidural fluid. There is no mass or midline shift. Gray-white compartments appear normal. No acute infarct. Vascular: No appreciable hyperdense vessel. There are no foci of arteriovascular calcification evident. Skull: Bony calvarium appears intact. Sinuses/Orbits: There is mucosal thickening in several ethmoid air cells bilaterally. There is opacification in the right sphenoid sinus region with probable polyp in this area measuring 8 x 7 mm. Other visualized paranasal sinuses are clear. Orbits appear symmetric  bilaterally. Other: Mastoid air cells are clear. CT CERVICAL SPINE FINDINGS Alignment:  There is no appreciable spondylolisthesis. Skull base and vertebrae: Skull base and craniocervical junction regions appear normal. There is no evident fracture. No blastic or lytic bone lesions. Soft tissues and spinal canal: Prevertebral soft tissues and predental space regions are normal. No paraspinous lesions. No evident cord or canal hematoma. Disc levels: There is moderate disc space narrowing at C5-6. There is slight disc space narrowing at C6-7 and C7-T1. There are posterior osteophytes at C5 and C6. There is an anterior osteophyte along the inferior aspect of C5. There is exit foraminal narrowing on the right at C5-6 due to bony hypertrophy. There is no frank disc extrusion or stenosis. Upper chest: Visualized upper lung zones are clear. Other:  None IMPRESSION: CT head: Small focus of subarachnoid hemorrhage just to the left of the falx at the level of the rostrum of the corpus callosum just left of midline. There is a focal area of hemorrhage measures 1.5 x 0.6 cm. There is no subdural or epidural fluid collection. Ventricles are normal in size and configuration. No midline shift or mass effect. No intracranial edema. There are foci of paranasal sinus disease. CT cervical spine: No fracture or spondylolisthesis. 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 Critical Value/emergent results were called by telephone at the time of interpretation on 09/21/2017 at 3:09 pm to [facility] , who verbally acknowledged these results. Electronically Signed   By: Bretta Bang III M.D.   On: 09/21/2017 15:10   Dg Chest [facility]  1 View  Result Date: 09/22/2017 CLINICAL DATA:  Left rib fracture EXAM: PORTABLE CHEST 1 VIEW COMPARISON:  Chest x-ray of 09/21/2017 FINDINGS: The fracture of the anterior left fifth rib is not as well seen on the current film. There is increased opacity at both lung bases most  consistent with atelectasis. Developing pneumonia cannot be excluded. No pleural effusion is noted. Mediastinal and hilar contours are unremarkable. The heart is within normal limits in size in view of poor inspiration. IMPRESSION: 1. The fracture of the anterior left fifth rib is not well seen on the current film. 2. Increasing basilar opacities most consistent with atelectasis. Developing pneumonia cannot be excluded. Electronically Signed   By: Dwyane Dee M.D.   On: 09/22/2017 08:01    Procedures Dr. Knute Neu (09/21/17) - Open reduction and internal fixation of the right distal radius with a [employer] volar plate and screws.  Hospital Course:  36 y/o male smoker, in town from work from [place], who presented to the emergency department as a non-trauma activation after he fell from a ladder on 09/21/17. Brief LOC and ambulatory on scene. Chief complaint of headache, right wrist pain, left chest pain, and blood from left ear. ED workup significant for right wrist fracture, single left rib fracture without PTX, and small subarachnoid hemorrhage (see imaging above) and the patient was admitted for further treatment and pain control. He underwent the above orthopedic procedure which he tolerated well and was transferred to the floor. Neurosurgery recommended observation of small SAH and determined that repeat CT scan was not necessary if the patient did not show any worsening neurologic symptoms. On hospital day #1 the patients vitals were stable, pain controlled with PO medications, denied headache, dizziness, vision loss, hearing changes, worked with physical and occupational therapies, and was medically stable for discharge. He lives in [place] and prefers to follow up with an Hydrologist at home, saying he knows a good orthopedic clinic he can contact. He has been asked to see an orthopedic surgeon within 1-2 weeks and knows to call with questions or concerns.    Allergies as of 09/22/2017    No Known Allergies     Medication List    TAKE these medications   acetaminophen 325 MG tablet Commonly known as:  TYLENOL Take 2 tablets (650 mg total) by mouth every 6 (six) hours as needed for mild pain.   HYDROcodone-acetaminophen 5-325 MG tablet Commonly known as:  NORCO/VICODIN Take 1 tablet by mouth every 4 (four) hours as needed for moderate pain.   methocarbamol 750 MG tablet Commonly known as:  ROBAXIN Take 1 tablet (750 mg total) by mouth every 8 (eight) hours as needed for muscle spasms.            Discharge Care Instructions        Start     Ordered   09/22/17 0000  acetaminophen (TYLENOL) 325 MG tablet  Every 6 hours PRN     09/22/17 1514   09/22/17 0000  HYDROcodone-acetaminophen (NORCO/VICODIN) 5-325 MG tablet  Every 4 hours PRN    Question:  Supervising Provider  Answer:  Violeta Gelinas   09/22/17 1514   09/22/17 0000  methocarbamol (ROBAXIN) 750 MG tablet  Every 8 hours PRN    Question:  Supervising Provider  Answer:  Violeta Gelinas   09/22/17 1514       Follow-up Information    Knute Neu, MD Follow up.   Specialty:  General Surgery Why:  call as  needed for questions/concerns regarding right wrist.  Contact information: [address] [place] [address] [phone]        [facility] Follow up.   Why:  call as needed. Contact information: [address] [place] [address] [phone]       Coletta Memos, MD Follow up.   Specialty:  Neurosurgery Why:  call as needed with questions/concerns regarding small intracranial bleed (brain bleed).  Contact information: [address] [place] [address] [phone]           Signed: Hosie Spangle, [facility] 09/22/2017, 3:21 PM Pager: [phone] Consults: [phone] Mon-Fri 7:00 am-4:30 pm Sat-Sun 7:00 am-11:30 am

## 2017-09-22 NOTE — Evaluation (Signed)
Physical Therapy Evaluation Patient Details Name: Anthony Roman MRN: [id number] DOB: 24-Mar-1981 Today's Date: 09/22/2017   History of Present Illness  Pt is a 36 y.o. male admitted on 09/21/17 after falling off a ladder; non trauma-code. Reports brief LOC, ambulatory at scene and drove self to urgent care; c/o pain in head, right wrist and left chest, blood from left ear. CT shows small focus of subarachnoid hemorrhage to L of falx. X-ray shows L 5th rib fx, R distal radial metaphysis fx. Now s/p R wrist ORIF on 9/26. No PMH on file.    Clinical Impression  Patient evaluated by Physical Therapy with no further acute PT needs identified. With cues for technique, pt progressed to indep for transfers and amb 600'. DGI score of 22/24 indicates decreased risk for falls. Educ on potential for having sustained a concussion and signs/symptoms associated with this. Pt reports generalized "grogginess" that he attributes to medications; no apparent cognitive deficits noted during PT evaluation. All education has been completed and the patient has no further questions. Pt lives with parents in [place], but may plan to d/c to local hotel while he organizes transportation back home. Encouraged to amb in hallway as able (RN & NT aware). PT is signing off. Thank you for this referral.    Follow Up Recommendations No PT follow up    Equipment Recommendations  None recommended by PT    Recommendations for Other Services OT consult     Precautions / Restrictions Precautions Precautions: None Restrictions Weight Bearing Restrictions: No      Mobility  Bed Mobility Overal bed mobility: Modified Independent             General bed mobility comments: Increased time with HOB elevated  Transfers Overall transfer level: Independent Equipment used: None             General transfer comment: Indep with pericare standing at toilet and brushing teeth at sink  Ambulation/Gait Ambulation/Gait  assistance: Supervision;Modified independent (Device/Increase time) Ambulation Distance (Feet): 600 Feet Assistive device: None Gait Pattern/deviations: WFL(Within Functional Limits)   Gait velocity interpretation: at or above normal speed for age/gender (with cues to increase speed) General Gait Details: Amb initially with supervision for safety, progressing to indep; able to keep RUE elevated during amb  Stairs Stairs: Yes Stairs assistance: Modified independent (Device/Increase time) Stair Management: Alternating pattern;Forwards;One rail Left Number of Stairs: 5    Wheelchair Mobility    Modified Rankin (Stroke Patients Only)       Balance                             High level balance activites: Side stepping;Backward walking;Direction changes;Turns;Sudden stops;Head turns;Braiding High Level Balance Comments: Pt reports slight uneasiness when looking up while amb, which quickly recovered; unable to recreate this Standardized Balance Assessment Standardized Balance Assessment : Dynamic Gait Index   Dynamic Gait Index Level Surface: Normal Change in Gait Speed: Normal Gait with Horizontal Head Turns: Normal Gait with Vertical Head Turns: Mild Impairment Gait and Pivot Turn: Normal Step Over Obstacle: Normal Step Around Obstacles: Normal Steps: Mild Impairment Total Score: 22       Pertinent Vitals/Pain Pain Assessment: Faces Faces Pain Scale: Hurts a little bit Pain Location: L rib  Pain Descriptors / Indicators: Discomfort;Sore;Grimacing Pain Intervention(s): Limited activity within patient's tolerance;Monitored during session    Home Living Family/patient expects to be discharged to:: Private residence Living Arrangements: Parent Available Help at Discharge:  Family;Available 24 hours/day Type of Home: House Home Access: Stairs to enter;Level entry   Entrance Stairs-Number of Steps: 3 (3 to enter main level; level entry at basement) Home  Layout: Multi-level;Able to live on main level with bedroom/bathroom   Additional Comments: Pt from [place], [place] where he lives with parents. May plan to temporarily d/c to a local hotel while he arranges for transportation back to [place].    Prior Function Level of Independence: Independent               Hand Dominance   Dominant Hand: Right    Extremity/Trunk Assessment   Upper Extremity Assessment Upper Extremity Assessment: RUE deficits/detail RUE Deficits / Details: s/p R wrist ORIF RUE: Unable to fully assess due to immobilization    Lower Extremity Assessment Lower Extremity Assessment: Overall WFL for tasks assessed       Communication   Communication: No difficulties  Cognition Arousal/Alertness: Awake/alert Behavior During Therapy: WFL for tasks assessed/performed Overall Cognitive Status: Within Functional Limits for tasks assessed                                 General Comments: Did not do in depth cognitive assessment. Pt reports generalized "grogginess" which he assumes is due to medication. Able to recall 3/3 items after 15 mins. A&Ox4. Following multi-step commands. Reports he has had a concussion before; educ on potential signs/symptoms of one.      General Comments      Exercises     Assessment/Plan    PT Assessment Patent does not need any further PT services  PT Problem List         PT Treatment Interventions      PT Goals (Current goals can be found in the Care Plan section)  Acute Rehab PT Goals Patient Stated Goal: Return home PT Goal Formulation: With patient Time For Goal Achievement: 10/06/17 Potential to Achieve Goals: Good    Frequency     Barriers to discharge        Co-evaluation               AM-PAC PT "6 Clicks" Daily Activity  Outcome Measure Difficulty turning over in bed (including adjusting bedclothes, sheets and blankets)?: None Difficulty moving from lying on back to sitting on the side of  the bed? : None Difficulty sitting down on and standing up from a chair with arms (e.g., wheelchair, bedside commode, etc,.)?: None Help needed moving to and from a bed to chair (including a wheelchair)?: None Help needed walking in hospital room?: None Help needed climbing 3-5 steps with a railing? : None 6 Click Score: 24    End of Session Equipment Utilized During Treatment: Gait belt Activity Tolerance: Patient tolerated treatment well Patient left: in chair;with call bell/phone within reach Nurse Communication: Mobility status PT Visit Diagnosis: Other abnormalities of gait and mobility (R26.89);Pain Pain - Right/Left: Right Pain - part of body: Arm;Hand    Time: 4098-1191 PT Time Calculation (min) (ACUTE ONLY): 31 min   Charges:   PT Evaluation $PT Eval Low Complexity: 1 Low PT Treatments $Gait Training: 8-22 mins   PT G Codes:       Ina Homes, PT, DPT Acute Rehab Services  Pager: [phone]  Malachy Chamber 09/22/2017, 8:54 AM

## 2017-09-22 NOTE — Discharge Instructions (Signed)
MAKE AN APPOINTMENT FOR FOLLOW UP WITH AN ORTHOPEDIC SURGEON IN [place] IN 7-14 DAYS. IF UNABLE TO ARRANGE FOLLOW UP PLEASE CALL THE ORTHOPEDIC SURGEON THAT OPERATED ON YOU (NUMBER PROVIDED)   RIGHT ARM INSTRUCTIONS: elevate, move fingers, do not bear weight on this wrist/lift anything, plan to see orthopedic surgeon who did your surgery within 2 weeks if you are still in [place], keep splint clean and dry.   PAIN: TYLENOL (ACETAMINOPHEN) 500-1,000 mg every 6 hours as needed ( DO NOT EXCEED 4,000 mg daily) - take lowest effective dose  HYDROCODONE-ACETAMINOPHEN 5-325 mg; 1 tablet every 4 hours as needed (PLEASE REMEMBER THIS DRUG CONTAINS TYLENOL and you should account for the amount of additional tylenol you take)   ROBAXIN 750 mg every 8 hours as needed for muscle cramps/pain.        Rib Fracture A rib fracture is a break or crack in one of the bones of the ribs. The ribs are like a cage that goes around your upper chest. A broken or cracked rib is often painful, but most do not cause other problems. Most rib fractures heal on their own in 1-3 months. Follow these instructions at home:  Avoid activities that cause pain to the injured area. Protect your injured area.  Slowly increase activity as told by your doctor.  Take medicine as told by your doctor.  Put ice on the injured area for the first 1-2 days after you have been treated or as told by your doctor. ? Put ice in a plastic bag. ? Place a towel between your skin and the bag. ? Leave the ice on for 15-20 minutes at a time, every 2 hours while you are awake.  Do deep breathing as told by your doctor. You may be told to: ? Take deep breaths many times a day. ? Cough many times a day while hugging a pillow. ? Use a device (incentive spirometer) to perform deep breathing many times a day!!!  Drink enough fluids to keep your pee (urine) clear or pale yellow.  Do not wear a rib belt or binder. These do not allow you  to breathe deeply. Get help right away if:  You have a fever.  You have trouble breathing.  You cannot stop coughing.  You cough up thick or bloody spit (mucus).  You feel sick to your stomach (nauseous), throw up (vomit), or have belly (abdominal) pain.  Your pain gets worse and medicine does not help. This information is not intended to replace advice given to you by your health care provider. Make sure you discuss any questions you have with your health care provider. Document Released: 09/21/2008 Document Revised: 05/20/2016 Document Reviewed: 02/14/2013 Elsevier Interactive Patient Education  [employer].

## 2017-09-22 NOTE — Care Management Note (Signed)
Case Management Note  Patient Details  Name: Anthony Roman MRN: [id number] Date of Birth: 01-27-1981  Subjective/Objective:   Pt admitted on 09/21/17 after falling off a ladder.  He sustained SAH, Lt fth rib fx and Rt distal radial metaphysis fx.  PTA, pt independent, lives with parents.                   Action/Plan: Pt medically stable for dc home today.  Family/ friend able to provide assistance at dc.  Pt is uninsured, but is eligible for medication assistance through [facility] program. [facility] letter given with explanation of program benefits.    Expected Discharge Date:  09/22/17               Expected Discharge Plan:  Home/Self Care  In-House Referral:  Clinical Social Work  Discharge planning Services  CM Consult  Post Acute Care Choice:    Choice offered to:     DME Arranged:    DME Agency:     HH Arranged:    HH Agency:     Status of Service:  Completed, signed off  If discussed at [employer], dates discussed:    Additional Comments:  Quintella Baton, RN, BSN  Trauma/Neuro ICU Case Manager [phone]

## 2017-09-22 NOTE — Progress Notes (Signed)
[facility] Progress Note  1 Day Post-Op  Subjective: CC:  Throbbing pain in R wrist and intermittent pain described as sharp muscle spasms L ribcage. Denies HA. Some lightheadedness when getting up from laying down. Mobilized well with PT. Pulling ~700 on IS.   Reports he has a good orthopedic contact in [place] that he might be able to F/U with.  Objective: Vital signs in last 24 hours: Temp:  [97.7 F (36.5 C)-98.9 F (37.2 C)] 97.7 F (36.5 C) (09/27 0452) Pulse Rate:  [80-102] 87 (09/27 0452) Resp:  [12-27] 19 (09/26 2100) BP: (106-147)/(59-99) 118/62 (09/27 0452) SpO2:  [91 %-99 %] 92 % (09/27 0452) Weight:  [95.3 kg (210 lb)] 95.3 kg (210 lb) (09/26 1855) Last BM Date: 09/21/17  Intake/Output from previous day: 09/26 0701 - 09/27 0700 In: 600 [I.V.:600] Out: 50 [Blood:50] Intake/Output this shift: No intake/output data recorded.  PE: Gen:  Alert, NAD, pleasant Card:  Regular rate and rhythm, pedal pulses 2+ BL Pulm:  Normal effort, clear to auscultation bilaterally with diminished breath sounds bilateral bases. Abd: Soft, non-tender, non-distended, bowel sounds present in all 4 quadrants MSK: R arm splinted, fingers WWP, good cap refill Skin: warm and dry, no rashes  Psych: A&Ox3   Lab Results:   Recent Labs  09/21/17 1732 09/22/17 0532  WBC 11.7* 10.2  HGB 14.6 14.4  HCT 44.1 43.7  PLT 216 207   BMET  Recent Labs  09/21/17 1732  NA 138  K 3.7  CL 106  CO2 24  GLUCOSE 90  BUN 9  CREATININE 1.01  CALCIUM 9.1   PT/INR  Recent Labs  09/21/17 1732  LABPROT 12.6  INR 0.95   CMP     Component Value Date/Time   NA 138 09/21/2017 1732   K 3.7 09/21/2017 1732   CL 106 09/21/2017 1732   CO2 24 09/21/2017 1732   GLUCOSE 90 09/21/2017 1732   BUN 9 09/21/2017 1732   CREATININE 1.01 09/21/2017 1732   CALCIUM 9.1 09/21/2017 1732   PROT 6.7 09/21/2017 1732   ALBUMIN 4.1 09/21/2017 1732   AST 33 09/21/2017 1732   ALT 33  09/21/2017 1732   ALKPHOS 50 09/21/2017 1732   BILITOT 1.0 09/21/2017 1732   GFRNONAA >60 09/21/2017 1732   GFRAA >60 09/21/2017 1732   Lipase  No results found for: LIPASE     Studies/Results: Dg Chest 2 View  Result Date: 09/21/2017 CLINICAL DATA:  Fall from ladder, left rib pain EXAM: CHEST  2 VIEW COMPARISON:  None. FINDINGS: Low lung volumes. Normal heart size. Normal mediastinal contour. No pneumothorax. No pleural effusion. Mild bibasilar atelectasis. No pulmonary edema. Acute mildly displaced anterolateral left fifth rib fracture. IMPRESSION: 1. Acute mildly displaced anterolateral left fifth rib fracture. 2. No pneumothorax. 3. Low lung volumes with mild bibasilar atelectasis. Electronically Signed   By: Delbert Phenix M.D.   On: 09/21/2017 15:03   Dg Wrist Complete Right  Result Date: 09/21/2017 CLINICAL DATA:  Patient porch falling 12-14 feet from a ladder and has deformity of the right forearm as well as right rib pain. EXAM: RIGHT WRIST - COMPLETE 3+ VIEW COMPARISON:  None in PACs FINDINGS: The patient has sustained a comminuted impacted fracture of the distal radial metaphysis. There is angulation at the fracture site with the apex volar. The adjacent ulna is intact. The carpal bones and metacarpal bases are normal. IMPRESSION: Acute impacted comminuted angulated fracture of the distal right radial metaphysis. Electronically Signed  By: David  [place] M.D.   On: 09/21/2017 15:02   Ct Head Wo Contrast  Result Date: 09/21/2017 CLINICAL DATA:  Pain following fall EXAM: CT HEAD WITHOUT CONTRAST CT CERVICAL SPINE WITHOUT CONTRAST TECHNIQUE: Multidetector CT imaging of the head and cervical spine was performed following the standard protocol without intravenous contrast. Multiplanar CT image reconstructions of the cervical spine were also generated. COMPARISON:  None. FINDINGS: CT HEAD FINDINGS Brain: The ventricles are normal in size and configuration. A small focus of subarachnoid  hemorrhage is noted just to the left of the falx at the level of the rostrum of the corpus callosum just to the left of midline. No other intra-axial hemorrhage is seen. There is no appreciable subdural or epidural fluid. There is no mass or midline shift. Gray-white compartments appear normal. No acute infarct. Vascular: No appreciable hyperdense vessel. There are no foci of arteriovascular calcification evident. Skull: Bony calvarium appears intact. Sinuses/Orbits: There is mucosal thickening in several ethmoid air cells bilaterally. There is opacification in the right sphenoid sinus region with probable polyp in this area measuring 8 x 7 mm. Other visualized paranasal sinuses are clear. Orbits appear symmetric bilaterally. Other: Mastoid air cells are clear. CT CERVICAL SPINE FINDINGS Alignment:  There is no appreciable spondylolisthesis. Skull base and vertebrae: Skull base and craniocervical junction regions appear normal. There is no evident fracture. No blastic or lytic bone lesions. Soft tissues and spinal canal: Prevertebral soft tissues and predental space regions are normal. No paraspinous lesions. No evident cord or canal hematoma. Disc levels: There is moderate disc space narrowing at C5-6. There is slight disc space narrowing at C6-7 and C7-T1. There are posterior osteophytes at C5 and C6. There is an anterior osteophyte along the inferior aspect of C5. There is exit foraminal narrowing on the right at C5-6 due to bony hypertrophy. There is no frank disc extrusion or stenosis. Upper chest: Visualized upper lung zones are clear. Other:  None IMPRESSION: CT head: Small focus of subarachnoid hemorrhage just to the left of the falx at the level of the rostrum of the corpus callosum just left of midline. There is a focal area of hemorrhage measures 1.5 x 0.6 cm. There is no subdural or epidural fluid collection. Ventricles are normal in size and configuration. No midline shift or mass effect. No  intracranial edema. There are foci of paranasal sinus disease. CT cervical spine: No fracture or spondylolisthesis. 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 Critical Value/emergent results were called by telephone at the time of interpretation on 09/21/2017 at 3:09 pm to [facility] , who verbally acknowledged these results. Electronically Signed   By: Bretta Bang III M.D.   On: 09/21/2017 15:10   Ct Cervical Spine Wo Contrast  Result Date: 09/21/2017 CLINICAL DATA:  Pain following fall EXAM: CT HEAD WITHOUT CONTRAST CT CERVICAL SPINE WITHOUT CONTRAST TECHNIQUE: Multidetector CT imaging of the head and cervical spine was performed following the standard protocol without intravenous contrast. Multiplanar CT image reconstructions of the cervical spine were also generated. COMPARISON:  None. FINDINGS: CT HEAD FINDINGS Brain: The ventricles are normal in size and configuration. A small focus of subarachnoid hemorrhage is noted just to the left of the falx at the level of the rostrum of the corpus callosum just to the left of midline. No other intra-axial hemorrhage is seen. There is no appreciable subdural or epidural fluid. There is no mass or midline shift. Gray-white compartments appear normal. No acute infarct. Vascular:  No appreciable hyperdense vessel. There are no foci of arteriovascular calcification evident. Skull: Bony calvarium appears intact. Sinuses/Orbits: There is mucosal thickening in several ethmoid air cells bilaterally. There is opacification in the right sphenoid sinus region with probable polyp in this area measuring 8 x 7 mm. Other visualized paranasal sinuses are clear. Orbits appear symmetric bilaterally. Other: Mastoid air cells are clear. CT CERVICAL SPINE FINDINGS Alignment:  There is no appreciable spondylolisthesis. Skull base and vertebrae: Skull base and craniocervical junction regions appear normal. There is no evident fracture. No blastic or lytic  bone lesions. Soft tissues and spinal canal: Prevertebral soft tissues and predental space regions are normal. No paraspinous lesions. No evident cord or canal hematoma. Disc levels: There is moderate disc space narrowing at C5-6. There is slight disc space narrowing at C6-7 and C7-T1. There are posterior osteophytes at C5 and C6. There is an anterior osteophyte along the inferior aspect of C5. There is exit foraminal narrowing on the right at C5-6 due to bony hypertrophy. There is no frank disc extrusion or stenosis. Upper chest: Visualized upper lung zones are clear. Other:  None IMPRESSION: CT head: Small focus of subarachnoid hemorrhage just to the left of the falx at the level of the rostrum of the corpus callosum just left of midline. There is a focal area of hemorrhage measures 1.5 x 0.6 cm. There is no subdural or epidural fluid collection. Ventricles are normal in size and configuration. No midline shift or mass effect. No intracranial edema. There are foci of paranasal sinus disease. CT cervical spine: No fracture or spondylolisthesis. 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 Critical Value/emergent results were called by telephone at the time of interpretation on 09/21/2017 at 3:09 pm to [facility] , who verbally acknowledged these results. Electronically Signed   By: Bretta Bang III M.D.   On: 09/21/2017 15:10   Dg Chest Port 1 View  Result Date: 09/22/2017 CLINICAL DATA:  Left rib fracture EXAM: PORTABLE CHEST 1 VIEW COMPARISON:  Chest x-ray of 09/21/2017 FINDINGS: The fracture of the anterior left fifth rib is not as well seen on the current film. There is increased opacity at both lung bases most consistent with atelectasis. Developing pneumonia cannot be excluded. No pleural effusion is noted. Mediastinal and hilar contours are unremarkable. The heart is within normal limits in size in view of poor inspiration. IMPRESSION: 1. The fracture of the anterior left  fifth rib is not well seen on the current film. 2. Increasing basilar opacities most consistent with atelectasis. Developing pneumonia cannot be excluded. Electronically Signed   By: Dwyane Dee M.D.   On: 09/22/2017 08:01   Anti-infectives: Anti-infectives    Start     Dose/Rate Route Frequency Ordered Stop   09/21/17 1908  ceFAZolin (ANCEF) 2-4 GM/100ML-% IVPB    Comments:  Schonewitz, Leigh   : cabinet override      09/21/17 1908 09/22/17 0714     Assessment/Plan Fall from ladder  [facility]  - NS consulted and recommends repeat CT if any change in neuro exam, otherwise just monitor. CBC WNL. Left 5th rib fx - pain control, pulm toilet, IS Right radial fracture - S/P ORIF distal radius 9/26, per Dr. Izora Ribas; volar splint Tobacco abuse   FEN: Regular,  VTE: SCDs, no lovenox due to [facility] ID: no current abx Foley: none   Dispo: floor, appreciate NS and ortho recs.  PT recommending no follow up. Anticipate discharge this afternoon vs tomorrow  AM.     LOS: 1 day    Adam Phenix , [facility] 09/22/2017, 8:52 AM Pager: [phone] Consults: [phone] Mon-Fri 7:00 am-4:30 pm Sat-Sun 7:00 am-11:30 am

## 2017-09-22 NOTE — Progress Notes (Signed)
S:  Some wrist soreness, no numbness; otherwise doing well  O:Blood pressure (!) 165/91, pulse (!) 103, temperature (!) 97 F (36.1 C), temperature source Oral, resp. rate 18, height  (1.702 m), weight 95.3 kg (210 lb), SpO2 95 %.  RUE: moving fingers, some swelling, n/v intact, splint in place  A: POD 1 ORIF R radius with volar plate - stable   P: elevate, move fingers, nwb, plan to see pt in 2 weeks in office if pt still in town, keep splint C/D/I

## 2017-09-22 NOTE — Op Note (Signed)
NAME:  Anthony Roman, Anthony Roman                     ACCOUNT NO.:  MEDICAL RECORD NO.:  [id number]  LOCATION:                                 FACILITY:  PHYSICIAN:  Johnette Abraham, MD         DATE OF BIRTH:  DATE OF PROCEDURE:  09/21/2017 DATE OF DISCHARGE:                              OPERATIVE REPORT   PREOPERATIVE DIAGNOSIS:  Closed displaced fracture of the right distal radius.  POSTOPERATIVE DIAGNOSIS:  Closed displaced fracture of the right distal radius.  PROCEDURE:  Open reduction and internal fixation of the right distal radius with a Biomet volar plate and screws.  INDICATIONS:  Mr. Azeez is a 36 year old gentleman, who came into the emergency department after falling from a height onto an outstretched hand sustaining a displaced right distal radius fracture.  He had some other injuries, which Trauma was consulted for.  He was cleared for surgery.  Risks, benefits, and alternatives of open reduction and internal fixation were discussed with the patient.  He agreed with this course of action and consent was obtained.  DESCRIPTION OF PROCEDURE:  The patient was taken to the operating room and placed supine on the operating table.  Anesthesia was administered without difficulty.  Time-out was performed.  The right upper extremity was prepped and draped in a normal sterile fashion.  The arm was exsanguinated.  A tourniquet was used on the upper arm, inflated to 250 mmHg.  An incision along the volar wrist was made overlying the FCR tendon.  Dissection was carried down to the tendon.  The interval between the FCR tendon and the radial artery was then followed down to the pronator quadratus muscle.  The pronator quadratus muscle was taken down in a hockey stick-type fashion exposing the fracture site.  The fracture site was distal, probably mid distal radius intra-articular component, multiple fragments.  The fracture site was debrided.  The fracture was irrigated with saline  solution.  The fracture was fairly easily reduced and appropriate size Biomet volar plate was chosen, held temporarily in place with K-wires and adjusted under fluoroscopy until nice central placement and length were obtained.  Afterwards, each of the radial shaft screws were drilled, measured and appropriate-sized cortical screws were placed for a total of 3.  Next, the distal radius screws were each drilled, measured and appropriate-sized both locking partially-threaded screws and locking pegs were placed.  Final x-ray examination on multiple views revealed near anatomic reduction of the fracture, excellent plate placement with excellent screw length.  The wound was then irrigated again with saline solution.  The muscles were approximated over the plate with 3-0 Vicryl.  The deep fascia was approximated with interrupted 3-0 Vicryl as well as the subcutaneous tissues.  The skin was closed with a running 4-0 Monocryl. Approximately 25 mL of plain Marcaine were easily infiltrated in the subcutaneous and fascial compartments for postoperative pain control.  Sterile dressing and volar splint were placed.  The patient tolerated the procedure well and was taken to the recovery room in stable condition.  She will be admitted under the care of Trauma Service for his other injuries.  Johnette Abraham, MD     HCC/MEDQ  D:  09/21/2017  T:  09/21/2017  Job:  [id number]

## 2017-09-22 NOTE — Evaluation (Signed)
Occupational Therapy Evaluation and Discharge  Patient Details Name: Anthony Roman MRN: [id number] DOB: 1981/09/19 Today's Date: 09/22/2017    History of Present Illness Pt is a 36 y.o. male admitted on 09/21/17 after falling off a ladder; non trauma-code. Reports brief LOC, ambulatory at scene and drove self to urgent care; c/o pain in head, right wrist and left chest, blood from left ear. CT shows small focus of subarachnoid hemorrhage to L of falx. X-ray shows L 5th rib fx, R distal radial metaphysis fx. Now s/p R wrist ORIF on 9/26. No PMH on file.   Clinical Impression   Pt reports he was independent with ADL PTA. Currently pt overall mod I for ADL and functional mobility due to pain and limited functional use of RUE. All education complete; pt verbalized understanding. Pt planning to d/c home to [place] with family assist available. No further acute OT needs identified; signing off at this time. Please re-consult if needs change. Thank you for this referral.    Follow Up Recommendations  No OT follow up;Supervision - Intermittent    Equipment Recommendations  None recommended by OT    Recommendations for Other Services       Precautions / Restrictions Precautions Precautions: None Restrictions Weight Bearing Restrictions: Yes RUE Weight Bearing: Non weight bearing      Mobility Bed Mobility Overal bed mobility: Modified Independent             General bed mobility comments: Increased time with use of bed rail  Transfers Overall transfer level: Modified independent Equipment used: None             General transfer comment: Increased time due to L rib pain    Balance Overall balance assessment: No apparent balance deficits (not formally assessed)                                         ADL either performed or assessed with clinical judgement   ADL Overall ADL's : Modified independent                                        General ADL Comments: Educated pt on RUE NWB, UB ADL compensatory strategies, wrapping RUE in plastic prior to showering, elevation and digit ROM for edema control, bracing ribs with pillow for pain control.     Vision         Perception     Praxis      Pertinent Vitals/Pain Pain Assessment: Faces Faces Pain Scale: Hurts even more Pain Location: L ribs Pain Descriptors / Indicators: Grimacing;Sharp Pain Intervention(s): Monitored during session;Repositioned     Hand Dominance Right   Extremity/Trunk Assessment Upper Extremity Assessment Upper Extremity Assessment: RUE deficits/detail RUE Deficits / Details: s/p R wrist ORIF, increased edema with limited digit AROM RUE: Unable to fully assess due to immobilization;Unable to fully assess due to pain   Lower Extremity Assessment Lower Extremity Assessment: Defer to PT evaluation   Cervical / Trunk Assessment Cervical / Trunk Assessment: Normal   Communication Communication Communication: No difficulties   Cognition Arousal/Alertness: Awake/alert Behavior During Therapy: WFL for tasks assessed/performed Overall Cognitive Status: Within Functional Limits for tasks assessed  General Comments       Exercises     Shoulder Instructions      Home Living Family/patient expects to be discharged to:: Private residence Living Arrangements: Parent Available Help at Discharge: Family;Available 24 hours/day Type of Home: House             Bathroom Shower/Tub: Tub/shower unit;Curtain   Bathroom Toilet: Standard     Home Equipment: None   Additional Comments: Pt from [name], [place] where he lives with his parents.      Prior Functioning/Environment Level of Independence: Independent                 OT Problem List:        OT Treatment/Interventions:      OT Goals(Current goals can be found in the care plan section) Acute Rehab OT Goals Patient Stated  Goal: Return home OT Goal Formulation: All assessment and education complete, DC therapy  OT Frequency:     Barriers to D/C:            Co-evaluation              AM-PAC PT "6 Clicks" Daily Activity     Outcome Measure Help from another person eating meals?: None Help from another person taking care of personal grooming?: None Help from another person toileting, which includes using toliet, bedpan, or urinal?: None Help from another person bathing (including washing, rinsing, drying)?: None Help from another person to put on and taking off regular upper body clothing?: None Help from another person to put on and taking off regular lower body clothing?: None 6 Click Score: 24   End of Session    Activity Tolerance: Patient tolerated treatment well Patient left: in chair;with call bell/phone within reach;with nursing/sitter in room;Other (comment) (MD in room)  OT Visit Diagnosis: Pain Pain - Right/Left: Right Pain - part of body: Arm (L ribs)                Time: 1610-9604 OT Time Calculation (min): 13 min Charges:  OT General Charges $OT Visit: 1 Visit OT Evaluation $OT Eval Low Complexity: 1 Low G-Codes:     Muad Noga A. Brett Albino, M.S., OTR/L Pager: [phone]  Gaye Alken 09/22/2017, 4:01 PM

## 2018-09-14 IMAGING — CT CT CERVICAL SPINE W/O CM
4 of 7 series · 14 of 33 positions shown, 15 images · non-contrast
Comparison: None.

CLINICAL DATA: Pain following fall

EXAM:
CT HEAD WITHOUT CONTRAST
CT CERVICAL SPINE WITHOUT CONTRAST
TECHNIQUE: Multidetector CT imaging of the head and cervical spine was
performed following the standard protocol without intravenous
contrast. Multiplanar CT image reconstructions of the cervical spine
were also generated.

[Series 6: head 3.0 mpr cor · coronal · 0.32mm/px · 2 of 66 slices shown]
[im 22/66  bone]
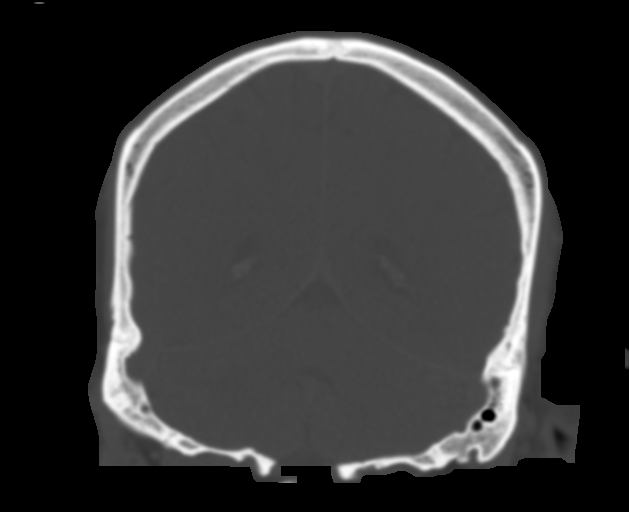
[im 44/66  bone]
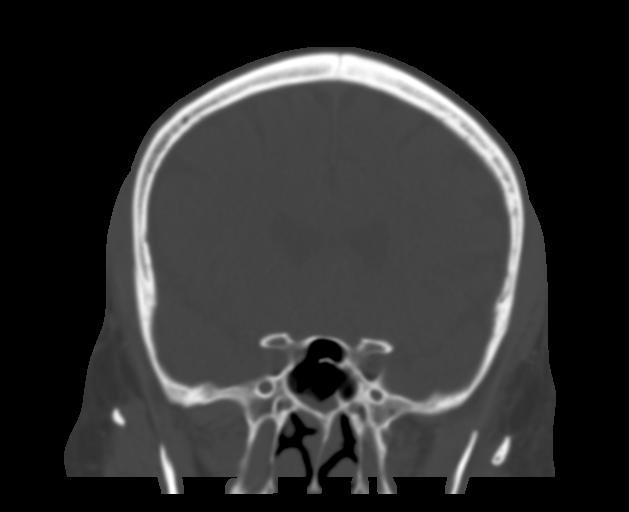

[Series 9: c_spine 2.0 st · axial · 0.29mm/px · z∈[-257,-141]mm · 4 of 98 slices shown, 5 images]
[im 20/98  soft-tissue]
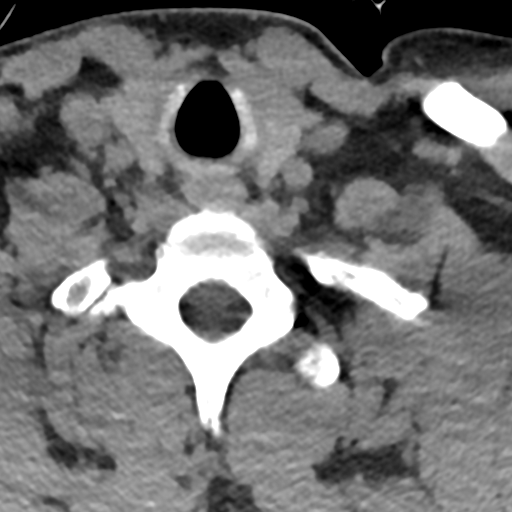
[im 20/98  bone]
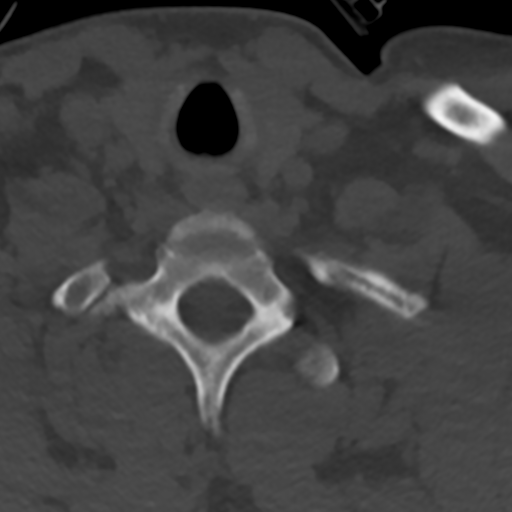
[im 39/98  bone]
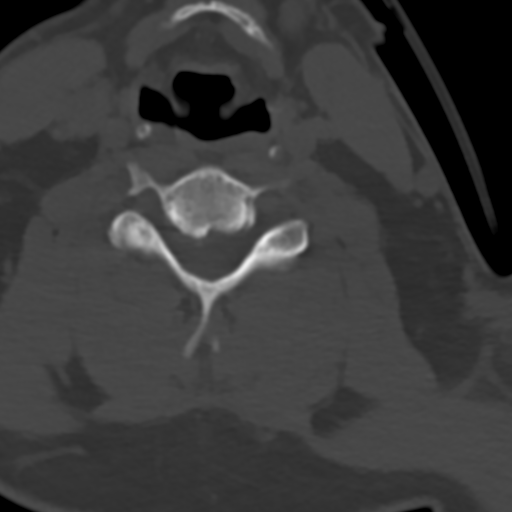
[im 59/98  bone]
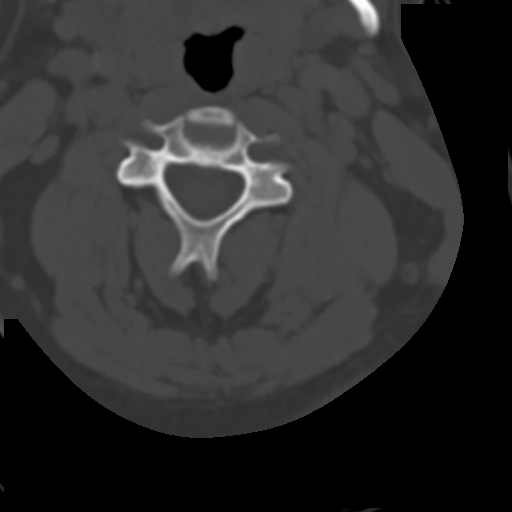
[im 78/98  bone]
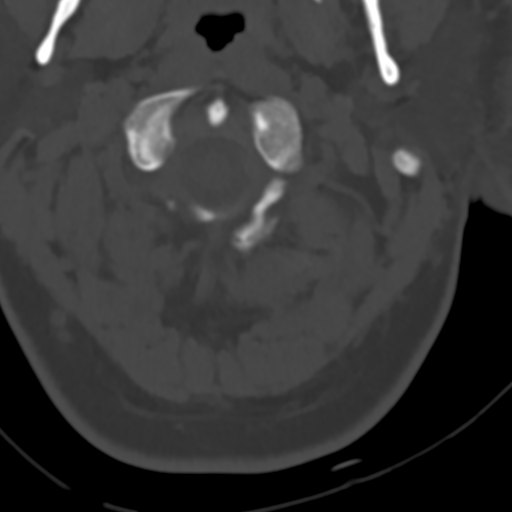

[Series 11: sagittal bone · sagittal · 0.32mm/px · 4 of 52 slices shown]
[im 11/52  bone]
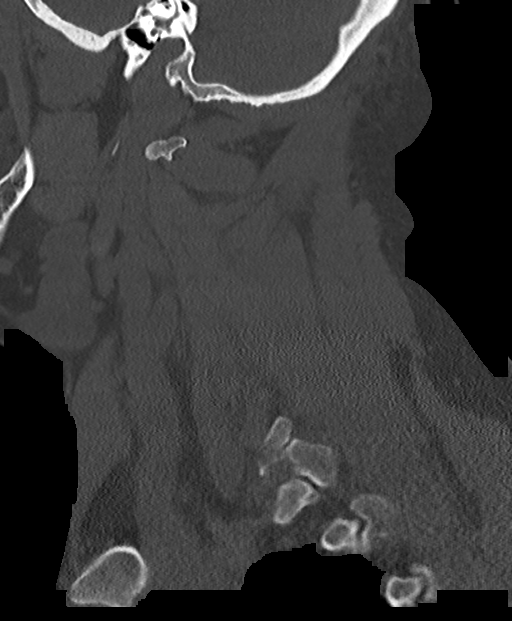
[im 21/52  bone]
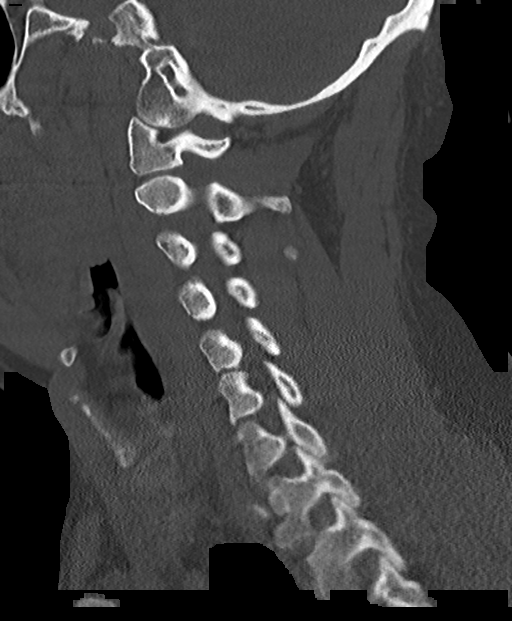
[im 31/52  bone]
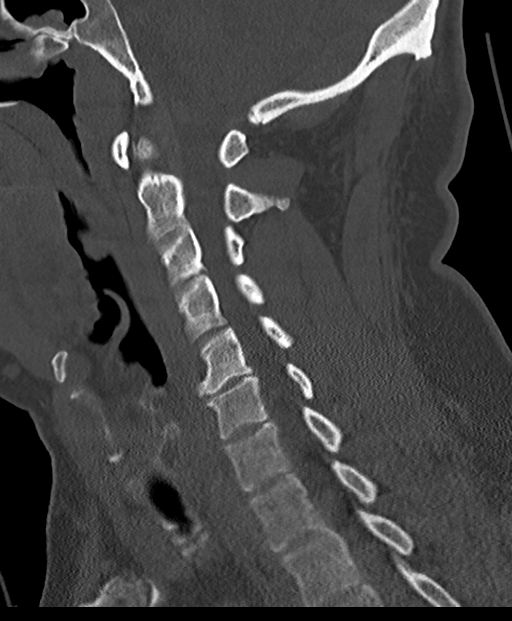
[im 41/52  bone]
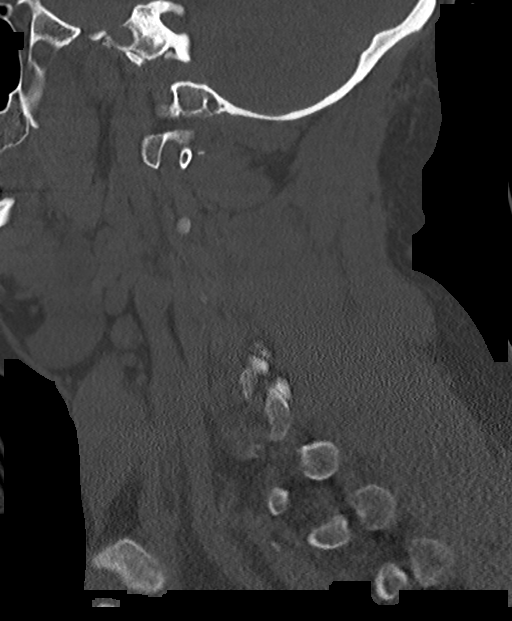

[Series 12: orthogonal axial bone · axial · 0.21mm/px · z∈[-276,-170]mm · 4 of 92 slices shown]
[im 19/92  bone]
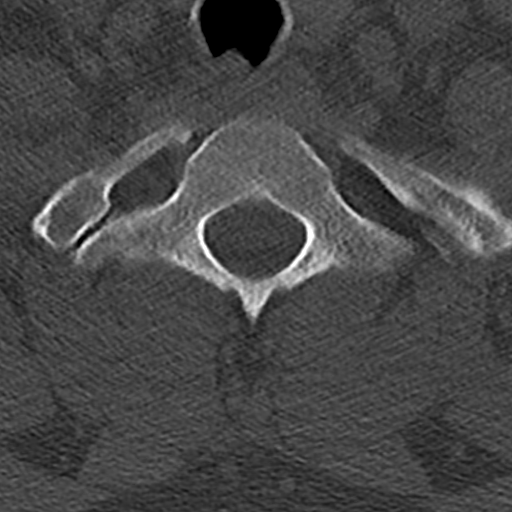
[im 37/92  bone]
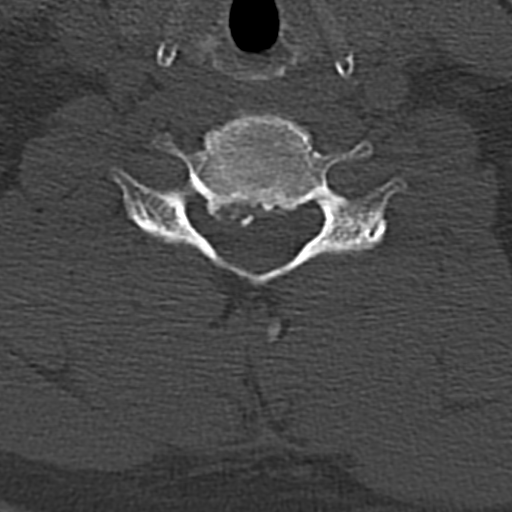
[im 55/92  bone]
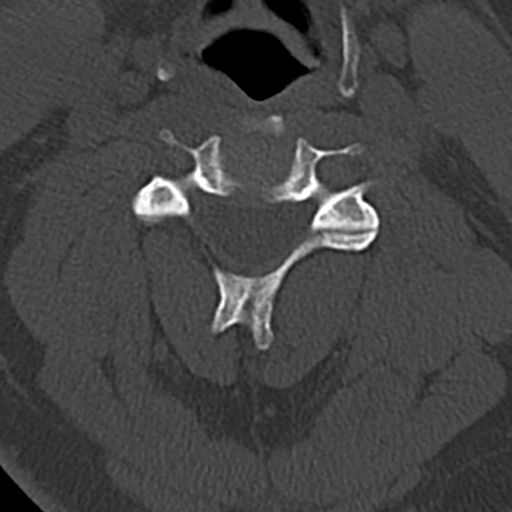
[im 73/92  bone]
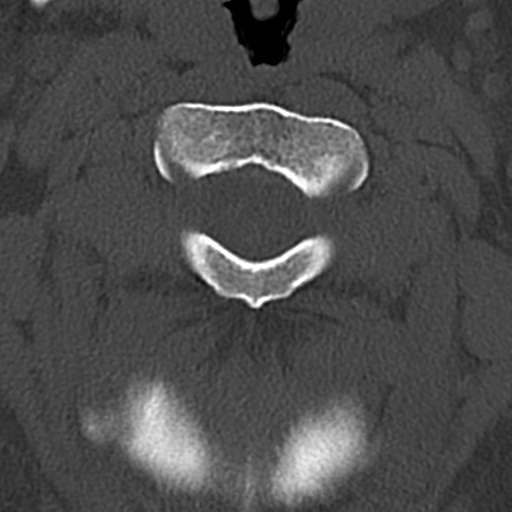

[14 of 33 positions shown; findings below may reference images not displayed]

FINDINGS: CT HEAD FINDINGS

Brain: The ventricles are normal in size and configuration. A small
focus of subarachnoid hemorrhage is noted just to the left of the
falx at the level of the rostrum of the corpus callosum just to the
left of midline. No other intra-axial hemorrhage is seen. There is
no appreciable subdural or epidural fluid. There is no mass or
midline shift. Gray-white compartments appear normal. No acute
infarct.

Vascular: No appreciable hyperdense vessel. There are no foci of
arteriovascular calcification evident.

Skull: Bony calvarium appears intact.

Sinuses/Orbits: There is mucosal thickening in several ethmoid air
cells bilaterally. There is opacification in the right sphenoid
sinus region with probable polyp in this area measuring 8 x 7 mm.
Other visualized paranasal sinuses are clear. Orbits appear
symmetric bilaterally.

Other: Mastoid air cells are clear.

CT CERVICAL SPINE FINDINGS

Alignment:  There is no appreciable spondylolisthesis.

Skull base and vertebrae: Skull base and craniocervical junction
regions appear normal. There is no evident fracture. No blastic or
lytic bone lesions.

Soft tissues and spinal canal: Prevertebral soft tissues and
predental space regions are normal. No paraspinous lesions. No
evident cord or canal hematoma.

Disc levels: There is moderate disc space narrowing at C5-6. There
is slight disc space narrowing at C6-7 and C7-T1. There are
posterior osteophytes at C5 and C6. There is an anterior osteophyte
along the inferior aspect of C5. There is exit foraminal narrowing
on the right at C5-6 due to bony hypertrophy. There is no frank disc
extrusion or stenosis.

Upper chest: Visualized upper lung zones are clear.

Other:  None
IMPRESSION: CT head: Small focus of subarachnoid hemorrhage just to the left of
the falx at the level of the rostrum of the corpus callosum just
left of midline. There is a focal area of hemorrhage measures 1.5 x
0.6 cm.

There is no subdural or epidural fluid collection. Ventricles are
normal in size and configuration. No midline shift or mass effect.
No intracranial edema. There are foci of paranasal sinus disease.

CT cervical spine: No fracture or spondylolisthesis. Osteoarthritic
change at several levels, most notably at C5-6.

Critical Value/emergent results were called by telephone at the time
of interpretation on 09/21/2017 at [DATE] to SHAMIMI KITA, PA ,
who verbally acknowledged these results.

## 2018-09-15 IMAGING — DX DG CHEST 1V PORT
1 series · 1 of 1 positions shown · non-contrast
Comparison: Chest x-ray of 09/21/2017

CLINICAL DATA: Left rib fracture

EXAM:
PORTABLE CHEST 1 VIEW

[chest ap]
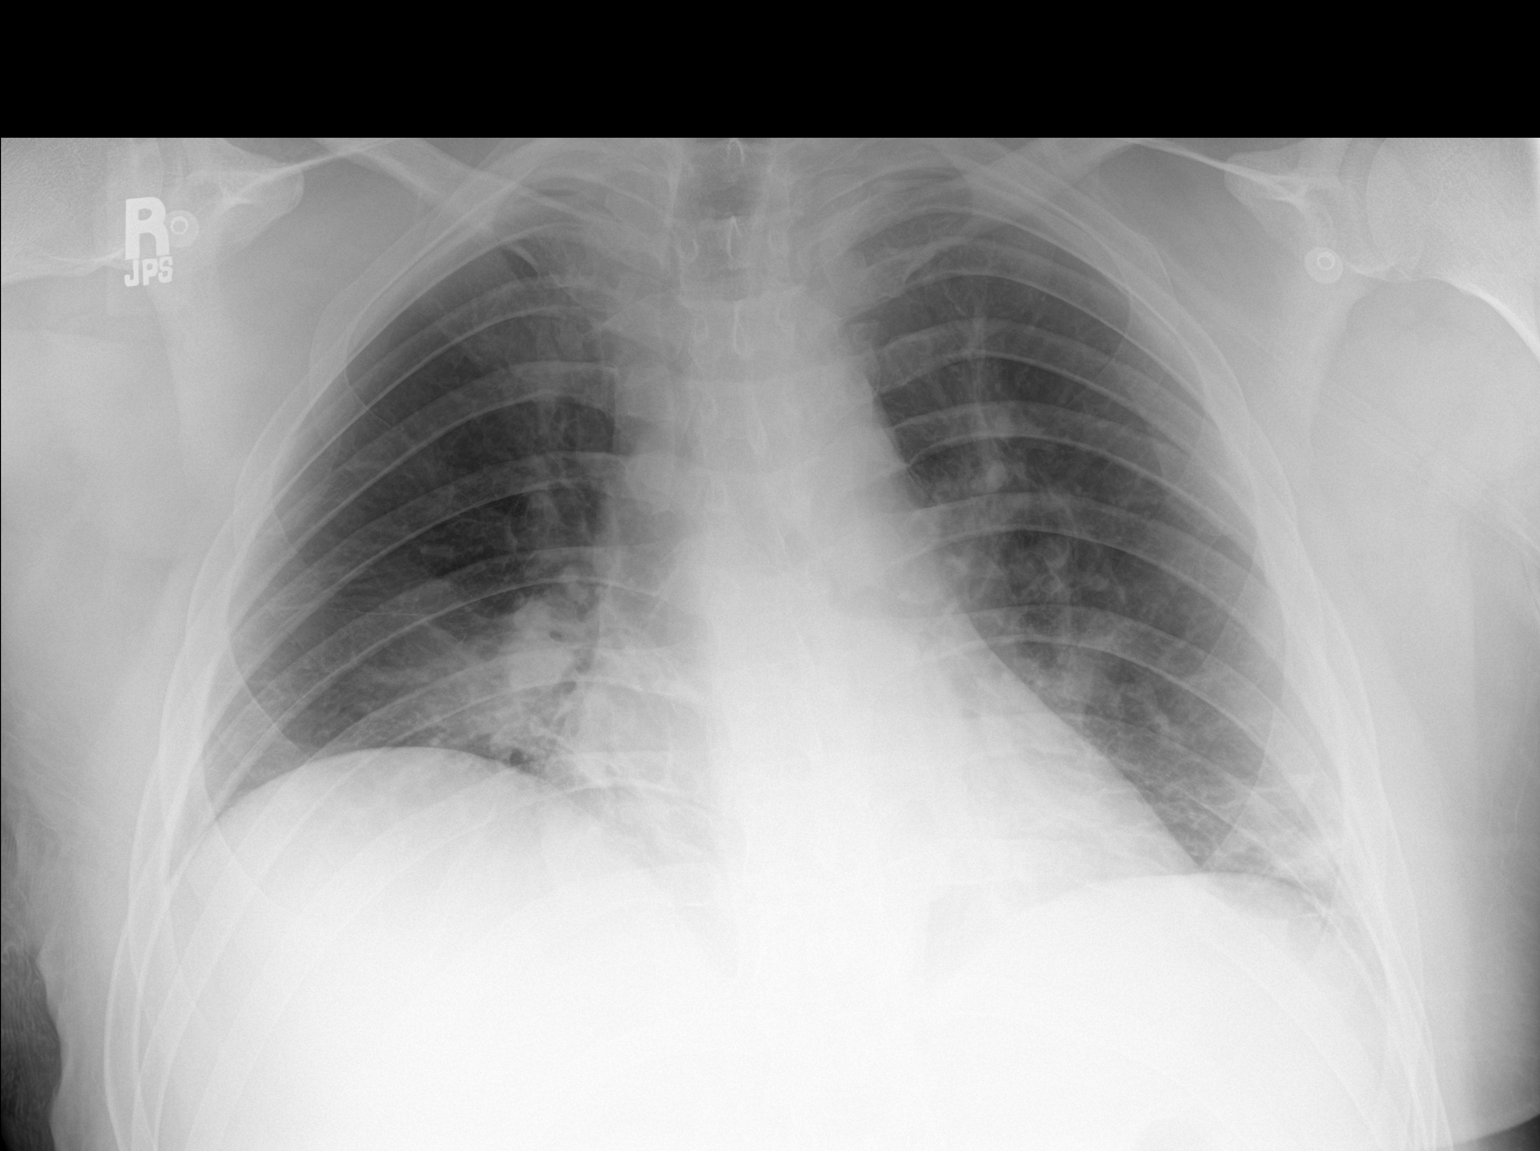

[1 of 1 positions shown; findings below may reference images not displayed]

FINDINGS: The fracture of the anterior left fifth rib is not as well seen on
the current film. There is increased opacity at both lung bases most
consistent with atelectasis. Developing pneumonia cannot be
excluded. No pleural effusion is noted. Mediastinal and hilar
contours are unremarkable. The heart is within normal limits in size
in view of poor inspiration.
IMPRESSION: 1. The fracture of the anterior left fifth rib is not well seen on
the current film.
2. Increasing basilar opacities most consistent with atelectasis.
Developing pneumonia cannot be excluded.
